# Patient Record
Sex: Female | Born: 1970 | Race: White | Hispanic: No | Marital: Married | Smoking: Never smoker
Health system: Southern US, Community
[De-identification: ages and names within clinical notes are randomized; demographics above are authoritative.]

## PROBLEM LIST (undated history)

## (undated) DIAGNOSIS — R519 Headache, unspecified: Secondary | ICD-10-CM

## (undated) DIAGNOSIS — R112 Nausea with vomiting, unspecified: Secondary | ICD-10-CM

## (undated) DIAGNOSIS — R0600 Dyspnea, unspecified: Secondary | ICD-10-CM

## (undated) DIAGNOSIS — K219 Gastro-esophageal reflux disease without esophagitis: Secondary | ICD-10-CM

## (undated) DIAGNOSIS — R52 Pain, unspecified: Secondary | ICD-10-CM

## (undated) DIAGNOSIS — M1990 Unspecified osteoarthritis, unspecified site: Secondary | ICD-10-CM

## (undated) DIAGNOSIS — M79601 Pain in right arm: Secondary | ICD-10-CM

## (undated) DIAGNOSIS — G589 Mononeuropathy, unspecified: Secondary | ICD-10-CM

## (undated) DIAGNOSIS — Z9889 Other specified postprocedural states: Secondary | ICD-10-CM

## (undated) DIAGNOSIS — S149XXA Injury of unspecified nerves of neck, initial encounter: Secondary | ICD-10-CM

## (undated) DIAGNOSIS — R51 Headache: Secondary | ICD-10-CM

## (undated) HISTORY — PX: DILATION AND CURETTAGE OF UTERUS: SHX78

## (undated) HISTORY — PX: SPINAL FUSION: SHX223

## (undated) HISTORY — PX: TUBAL LIGATION: SHX77

## (undated) HISTORY — PX: COLLATERAL LIGAMENT REPAIR, ELBOW: SHX1367

## (undated) HISTORY — PX: TONSILLECTOMY: SUR1361

---

## 2008-03-16 ENCOUNTER — Ambulatory Visit: Payer: Self-pay | Admitting: Family Medicine

## 2008-10-14 ENCOUNTER — Ambulatory Visit: Payer: Self-pay | Admitting: Family Medicine

## 2009-03-05 ENCOUNTER — Emergency Department: Payer: Self-pay | Admitting: Emergency Medicine

## 2009-03-07 ENCOUNTER — Ambulatory Visit: Payer: Self-pay | Admitting: Unknown Physician Specialty

## 2009-05-07 ENCOUNTER — Ambulatory Visit: Payer: Self-pay | Admitting: Internal Medicine

## 2010-01-27 ENCOUNTER — Inpatient Hospital Stay (HOSPITAL_COMMUNITY): Admission: AD | Admit: 2010-01-27 | Discharge: 2010-01-29 | Payer: Self-pay | Admitting: Obstetrics and Gynecology

## 2010-03-22 ENCOUNTER — Ambulatory Visit (HOSPITAL_COMMUNITY): Admission: RE | Admit: 2010-03-22 | Discharge: 2010-03-22 | Payer: Self-pay | Admitting: Obstetrics and Gynecology

## 2010-04-03 ENCOUNTER — Encounter: Admission: RE | Admit: 2010-04-03 | Discharge: 2010-04-03 | Payer: Self-pay | Admitting: Neurosurgery

## 2010-04-09 ENCOUNTER — Encounter: Admission: RE | Admit: 2010-04-09 | Discharge: 2010-04-09 | Payer: Self-pay | Admitting: Neurosurgery

## 2011-02-27 LAB — CBC
HCT: 29.8 % — ABNORMAL LOW (ref 36.0–46.0)
MCHC: 32.9 g/dL (ref 30.0–36.0)
MCV: 94.3 fL (ref 78.0–100.0)
Platelets: 257 10*3/uL (ref 150–400)
Platelets: 310 10*3/uL (ref 150–400)
RBC: 3.17 MIL/uL — ABNORMAL LOW (ref 3.87–5.11)
RDW: 13.7 % (ref 11.5–15.5)
RDW: 14.1 % (ref 11.5–15.5)
WBC: 5.5 10*3/uL (ref 4.0–10.5)
WBC: 10.8 10*3/uL — ABNORMAL HIGH (ref 4.0–10.5)
WBC: 13.1 10*3/uL — ABNORMAL HIGH (ref 4.0–10.5)

## 2011-02-27 LAB — RPR: RPR Ser Ql: NONREACTIVE

## 2011-06-26 ENCOUNTER — Ambulatory Visit: Payer: Self-pay | Admitting: Unknown Physician Specialty

## 2012-05-16 ENCOUNTER — Ambulatory Visit: Payer: Self-pay | Admitting: Internal Medicine

## 2012-05-16 LAB — CBC WITH DIFFERENTIAL/PLATELET
Basophil #: 0 10*3/uL (ref 0.0–0.1)
HCT: 45.7 % (ref 35.0–47.0)
HGB: 15.1 g/dL (ref 12.0–16.0)
Lymphocyte #: 0.6 10*3/uL — ABNORMAL LOW (ref 1.0–3.6)
MCH: 31.5 pg (ref 26.0–34.0)
Monocyte #: 0.7 x10 3/mm (ref 0.2–0.9)
Neutrophil %: 90.3 %
Platelet: 176 10*3/uL (ref 150–440)
RBC: 4.79 10*6/uL (ref 3.80–5.20)
RDW: 13.2 % (ref 11.5–14.5)
WBC: 13 10*3/uL — ABNORMAL HIGH (ref 3.6–11.0)

## 2012-05-16 LAB — URINALYSIS, COMPLETE
Glucose,UR: NEGATIVE mg/dL (ref 0–75)
Leukocyte Esterase: NEGATIVE
Nitrite: NEGATIVE
Ph: 8 (ref 4.5–8.0)
Specific Gravity: 1.015 (ref 1.003–1.030)
WBC UR: NONE SEEN /HPF (ref 0–5)

## 2012-05-16 LAB — COMPREHENSIVE METABOLIC PANEL
Alkaline Phosphatase: 79 U/L (ref 50–136)
Anion Gap: 10 (ref 7–16)
Chloride: 99 mmol/L (ref 98–107)
Co2: 27 mmol/L (ref 21–32)
Creatinine: 0.72 mg/dL (ref 0.60–1.30)
Glucose: 120 mg/dL — ABNORMAL HIGH (ref 65–99)
SGPT (ALT): 24 U/L
Sodium: 136 mmol/L (ref 136–145)
Total Protein: 7.4 g/dL (ref 6.4–8.2)

## 2012-05-18 LAB — URINE CULTURE

## 2013-04-19 DIAGNOSIS — N3020 Other chronic cystitis without hematuria: Secondary | ICD-10-CM | POA: Insufficient documentation

## 2013-04-19 DIAGNOSIS — R339 Retention of urine, unspecified: Secondary | ICD-10-CM | POA: Insufficient documentation

## 2013-04-19 DIAGNOSIS — N393 Stress incontinence (female) (male): Secondary | ICD-10-CM | POA: Insufficient documentation

## 2014-01-31 ENCOUNTER — Ambulatory Visit: Payer: Self-pay | Admitting: Internal Medicine

## 2014-03-15 ENCOUNTER — Other Ambulatory Visit: Payer: Self-pay | Admitting: Obstetrics and Gynecology

## 2014-03-15 ENCOUNTER — Encounter (HOSPITAL_COMMUNITY): Payer: Self-pay | Admitting: *Deleted

## 2014-03-17 ENCOUNTER — Encounter (HOSPITAL_COMMUNITY): Payer: Self-pay | Admitting: Pharmacist

## 2014-03-17 NOTE — H&P (Signed)
NAMRico Ala:  Veronica [name], Veronica [name]                 ACCOUNT NO.:  [id number]  MEDICAL RECORD NO.:  [id number]  LOCATION:  [facility]                         FACILITY:  [facility]  PHYSICIAN:  Lenoard Adenichard J. Gaelen Brager, M.D.DATE OF BIRTH:  04-Nov-1971  DATE OF ADMISSION:  03/18/2014 DATE OF DISCHARGE:                             HISTORY & PHYSICAL   CHIEF COMPLAINT:  Symptomatic rectocele, refractory menorrhagia with desire for definitive therapy.  HISTORY OF PRESENT ILLNESS:  The patient is a 43 year old white female, G3, P1 with a history of tubal ligation who presented for aforementioned indications for surgery.  ALLERGIES:  TYLENOL AND PENICILLIN.  FAMILY HISTORY:  Diabetes, hypertension, kidney disease, heart failure, mental retardation.  SURGICAL HISTORY:  Pregnancy is remarkable for 2 vaginal deliveries and 1 spontaneous delivery.  PAST SURGICAL HISTORY:  Spinal fusion, tubal ligation, neck surgery, D and C.  PHYSICAL EXAMINATION:  GENERAL:  She is a well-developed, well- nourished, white female, in no acute distress. HEENT:  Normal. NECK:  Supple.  Full range of motion. LUNGS:  Clear. HEART:  Regular rate and rhythm. ABDOMEN:  Soft, nontender. PELVIC:  Normal size uterus.  No adnexal masses.  Grade 1-2 rectocele noted. EXTREMITIES:  There are no cords. NEUROLOGIC: Nonfocal. SKIN:  Intact.  IMPRESSION:  Refractory Menorrhagia Symptomatic pelvic relaxation  PLANS:  Diagnostic hysteroscopy, D and C, NovaSure endometrial ablation, anterior colporrhaphy, possible perineorrhaphy, possible enterocele repair.  Risks of anesthesia, infection, bleeding, injury to surrounding organs, possible need for repair is discussed.  Delayed versus immediate complications to include bowel and bladder injury with need for repair.  Consent signed and pt wishes to proceed.     Lenoard Adenichard J. Egor Fullilove, M.D.     RJT/MEDQ  D:  03/17/2014  T:  03/17/2014  Job:  [id number]

## 2014-03-18 ENCOUNTER — Ambulatory Visit (HOSPITAL_COMMUNITY): Payer: 59 | Admitting: Anesthesiology

## 2014-03-18 ENCOUNTER — Encounter (HOSPITAL_COMMUNITY)
Admission: RE | Disposition: A | Discharge status: Home / Self Care | Payer: Self-pay | Source: Ambulatory Visit | Attending: Obstetrics and Gynecology

## 2014-03-18 ENCOUNTER — Encounter (HOSPITAL_COMMUNITY): Payer: Self-pay | Admitting: *Deleted

## 2014-03-18 ENCOUNTER — Encounter (HOSPITAL_COMMUNITY): Payer: 59 | Admitting: Anesthesiology

## 2014-03-18 ENCOUNTER — Ambulatory Visit (HOSPITAL_COMMUNITY)
Admission: RE | Admit: 2014-03-18 | Discharge: 2014-03-18 | Disposition: A | Discharge status: Home / Self Care | Payer: 59 | Source: Ambulatory Visit | Attending: Obstetrics and Gynecology | Admitting: Obstetrics and Gynecology

## 2014-03-18 DIAGNOSIS — N840 Polyp of corpus uteri: Secondary | ICD-10-CM | POA: Insufficient documentation

## 2014-03-18 DIAGNOSIS — N816 Rectocele: Secondary | ICD-10-CM | POA: Insufficient documentation

## 2014-03-18 DIAGNOSIS — Z9851 Tubal ligation status: Secondary | ICD-10-CM | POA: Insufficient documentation

## 2014-03-18 DIAGNOSIS — N920 Excessive and frequent menstruation with regular cycle: Secondary | ICD-10-CM | POA: Insufficient documentation

## 2014-03-18 HISTORY — DX: Nausea with vomiting, unspecified: R11.2

## 2014-03-18 HISTORY — PX: RECTOCELE REPAIR: SHX761

## 2014-03-18 HISTORY — PX: DILITATION & CURRETTAGE/HYSTROSCOPY WITH VERSAPOINT RESECTION: SHX5571

## 2014-03-18 HISTORY — PX: NOVASURE ABLATION: SHX5394

## 2014-03-18 HISTORY — DX: Nausea with vomiting, unspecified: Z98.890

## 2014-03-18 LAB — CBC
HCT: 43.8 % (ref 36.0–46.0)
Hemoglobin: 14.7 g/dL (ref 12.0–15.0)
MCH: 31.4 pg (ref 26.0–34.0)
MCHC: 33.6 g/dL (ref 30.0–36.0)
MCV: 93.6 fL (ref 78.0–100.0)
PLATELETS: 299 10*3/uL (ref 150–400)
RBC: 4.68 MIL/uL (ref 3.87–5.11)
RDW: 12.9 % (ref 11.5–15.5)
WBC: 8.6 10*3/uL (ref 4.0–10.5)

## 2014-03-18 LAB — HCG, SERUM, QUALITATIVE: Preg, Serum: NEGATIVE

## 2014-03-18 SURGERY — DILATATION & CURETTAGE/HYSTEROSCOPY WITH VERSAPOINT RESECTION
Anesthesia: General | Site: Vagina

## 2014-03-18 MED ORDER — BUPIVACAINE-EPINEPHRINE (PF) 0.5% -1:200000 IJ SOLN
INTRAMUSCULAR | Status: AC
  Filled 2014-03-18: qty 10

## 2014-03-18 MED ORDER — MIDAZOLAM HCL 2 MG/2ML IJ SOLN
INTRAMUSCULAR | Status: AC
  Filled 2014-03-18: qty 2

## 2014-03-18 MED ORDER — ONDANSETRON HCL 4 MG/2ML IJ SOLN
INTRAMUSCULAR | Status: AC
  Filled 2014-03-18: qty 2

## 2014-03-18 MED ORDER — ESTRADIOL 0.1 MG/GM VA CREA
TOPICAL_CREAM | VAGINAL | Status: AC
  Filled 2014-03-18: qty 42.5

## 2014-03-18 MED ORDER — LIDOCAINE HCL (CARDIAC) 20 MG/ML IV SOLN
INTRAVENOUS | Status: AC
  Filled 2014-03-18: qty 5

## 2014-03-18 MED ORDER — OXYCODONE-ACETAMINOPHEN 5-325 MG PO TABS: 1.0000 | ORAL_TABLET | ORAL | Status: DC | PRN

## 2014-03-18 MED ORDER — PHENYLEPHRINE 40 MCG/ML (10ML) SYRINGE FOR IV PUSH (FOR BLOOD PRESSURE SUPPORT)
PREFILLED_SYRINGE | INTRAVENOUS | Status: AC
  Filled 2014-03-18: qty 5

## 2014-03-18 MED ORDER — PHENYLEPHRINE HCL 10 MG/ML IJ SOLN
INTRAMUSCULAR | Status: DC | PRN
  Administered 2014-03-18 (×11): 40 ug via INTRAVENOUS

## 2014-03-18 MED ORDER — BUPIVACAINE-EPINEPHRINE 0.25% -1:200000 IJ SOLN
INTRAMUSCULAR | Status: DC | PRN
  Administered 2014-03-18: 10 mL

## 2014-03-18 MED ORDER — GENTAMICIN SULFATE 40 MG/ML IJ SOLN
INTRAVENOUS | Status: AC
  Administered 2014-03-18: 114.25 mL via INTRAVENOUS
  Filled 2014-03-18: qty 8.25

## 2014-03-18 MED ORDER — DEXAMETHASONE SODIUM PHOSPHATE 10 MG/ML IJ SOLN
INTRAMUSCULAR | Status: DC | PRN
  Administered 2014-03-18: 5 mg via INTRAVENOUS

## 2014-03-18 MED ORDER — ONDANSETRON HCL 4 MG/2ML IJ SOLN
INTRAMUSCULAR | Status: DC | PRN
  Administered 2014-03-18: 4 mg via INTRAVENOUS

## 2014-03-18 MED ORDER — BUPIVACAINE-EPINEPHRINE PF 0.25-1:200000 % IJ SOLN
INTRAMUSCULAR | Status: AC
  Filled 2014-03-18: qty 30

## 2014-03-18 MED ORDER — LIDOCAINE HCL (CARDIAC) 20 MG/ML IV SOLN
INTRAVENOUS | Status: DC | PRN
  Administered 2014-03-18: 60 mg via INTRAVENOUS

## 2014-03-18 MED ORDER — MIDAZOLAM HCL 2 MG/2ML IJ SOLN
INTRAMUSCULAR | Status: DC | PRN
  Administered 2014-03-18: 2 mg via INTRAVENOUS

## 2014-03-18 MED ORDER — SUCCINYLCHOLINE CHLORIDE 20 MG/ML IJ SOLN
INTRAMUSCULAR | Status: AC
  Filled 2014-03-18: qty 10

## 2014-03-18 MED ORDER — FENTANYL CITRATE 0.05 MG/ML IJ SOLN
INTRAMUSCULAR | Status: AC
  Filled 2014-03-18: qty 5

## 2014-03-18 MED ORDER — BUPIVACAINE HCL (PF) 0.25 % IJ SOLN
INTRAMUSCULAR | Status: DC | PRN
  Administered 2014-03-18: 10 mL

## 2014-03-18 MED ORDER — SODIUM CHLORIDE 0.9 % IJ SOLN
INTRAMUSCULAR | Status: DC | PRN
  Administered 2014-03-18: 50 mL via INTRAVENOUS

## 2014-03-18 MED ORDER — VASOPRESSIN 20 UNIT/ML IJ SOLN
INTRAVENOUS | Status: DC | PRN
  Administered 2014-03-18: 16:00:00 via INTRAMUSCULAR

## 2014-03-18 MED ORDER — DEXAMETHASONE SODIUM PHOSPHATE 10 MG/ML IJ SOLN
INTRAMUSCULAR | Status: AC
  Filled 2014-03-18: qty 1

## 2014-03-18 MED ORDER — VASOPRESSIN 20 UNIT/ML IJ SOLN
INTRAMUSCULAR | Status: AC
  Filled 2014-03-18: qty 1

## 2014-03-18 MED ORDER — SODIUM CHLORIDE 0.9 % IR SOLN
Status: DC | PRN
  Administered 2014-03-18: 3000 mL

## 2014-03-18 MED ORDER — SODIUM CHLORIDE 0.9 % IJ SOLN
INTRAMUSCULAR | Status: AC
  Filled 2014-03-18: qty 50

## 2014-03-18 MED ORDER — FENTANYL CITRATE 0.05 MG/ML IJ SOLN
INTRAMUSCULAR | Status: AC
  Filled 2014-03-18: qty 2

## 2014-03-18 MED ORDER — PROPOFOL 10 MG/ML IV EMUL
INTRAVENOUS | Status: AC
  Filled 2014-03-18: qty 20

## 2014-03-18 MED ORDER — LACTATED RINGERS IV SOLN
INTRAVENOUS | Status: DC
  Administered 2014-03-18 (×5): via INTRAVENOUS

## 2014-03-18 MED ORDER — FENTANYL CITRATE 0.05 MG/ML IJ SOLN
INTRAMUSCULAR | Status: DC | PRN
  Administered 2014-03-18 (×6): 50 ug via INTRAVENOUS

## 2014-03-18 MED ORDER — BUPIVACAINE HCL (PF) 0.25 % IJ SOLN
INTRAMUSCULAR | Status: AC
  Filled 2014-03-18: qty 30

## 2014-03-18 MED ORDER — OXYTOCIN 10 UNIT/ML IJ SOLN
INTRAMUSCULAR | Status: AC
  Filled 2014-03-18: qty 4

## 2014-03-18 MED ORDER — FENTANYL CITRATE 0.05 MG/ML IJ SOLN: 25.0000 ug | INTRAMUSCULAR | Status: DC | PRN

## 2014-03-18 MED ORDER — PROPOFOL 10 MG/ML IV BOLUS
INTRAVENOUS | Status: DC | PRN
  Administered 2014-03-18: 50 mg via INTRAVENOUS
  Administered 2014-03-18: 150 mg via INTRAVENOUS

## 2014-03-18 MED ORDER — OXYCODONE-ACETAMINOPHEN 5-325 MG PO TABS
ORAL_TABLET | ORAL | Status: AC
  Filled 2014-03-18: qty 1

## 2014-03-18 SURGICAL SUPPLY — 35 items
ABLATOR ENDOMETRIAL BIPOLAR (ABLATOR) ×3 IMPLANT
CANISTER SUCT 3000ML (MISCELLANEOUS) ×3 IMPLANT
CATH ROBINSON RED A/P 16FR (CATHETERS) ×3 IMPLANT
CLOTH BEACON ORANGE TIMEOUT ST (SAFETY) ×3 IMPLANT
CONT PATH 16OZ SNAP LID 3702 (MISCELLANEOUS) IMPLANT
CONTAINER PREFILL 10% NBF 60ML (FORM) ×6 IMPLANT
DECANTER SPIKE VIAL GLASS SM (MISCELLANEOUS) ×3 IMPLANT
DRAPE HYSTEROSCOPY (DRAPE) ×3 IMPLANT
DRSG TELFA 3X8 NADH (GAUZE/BANDAGES/DRESSINGS) ×3 IMPLANT
ELECT NEEDLE TIP 2.8 STRL (NEEDLE) IMPLANT
ELECTRODE RT ANGLE VERSAPOINT (CUTTING LOOP) ×3 IMPLANT
GLOVE BIO SURGEON STRL SZ7.5 (GLOVE) ×6 IMPLANT
GOWN STRL REUS W/TWL LRG LVL3 (GOWN DISPOSABLE) ×15 IMPLANT
NEEDLE HYPO 22GX1.5 SAFETY (NEEDLE) ×3 IMPLANT
NEEDLE SPNL 22GX3.5 QUINCKE BK (NEEDLE) ×6 IMPLANT
NS IRRIG 1000ML POUR BTL (IV SOLUTION) ×3 IMPLANT
PACK VAGINAL MINOR WOMEN LF (CUSTOM PROCEDURE TRAY) ×3 IMPLANT
PACK VAGINAL WOMENS (CUSTOM PROCEDURE TRAY) ×3 IMPLANT
PAD OB MATERNITY 4.3X12.25 (PERSONAL CARE ITEMS) ×3 IMPLANT
PAD PREP 24X48 CUFFED NSTRL (MISCELLANEOUS) ×3 IMPLANT
SET TUBING HYSTEROSCOPY 2 NDL (TUBING) IMPLANT
SUT MON AB 2-0 CT2 27 (SUTURE) IMPLANT
SUT VIC AB 0 CT1 18XCR BRD8 (SUTURE) ×2 IMPLANT
SUT VIC AB 0 CT1 27 (SUTURE) ×1
SUT VIC AB 0 CT1 27XBRD ANBCTR (SUTURE) ×2 IMPLANT
SUT VIC AB 0 CT1 8-18 (SUTURE) ×1
SUT VIC AB 2-0 CT1 27 (SUTURE) ×3
SUT VIC AB 2-0 CT1 TAPERPNT 27 (SUTURE) ×6 IMPLANT
SUT VICRYL 3 0 CT 3 (SUTURE) IMPLANT
SYR CONTROL 10ML LL (SYRINGE) ×6 IMPLANT
SYR TB 1ML 25GX5/8 (SYRINGE) ×3 IMPLANT
TOWEL OR 17X24 6PK STRL BLUE (TOWEL DISPOSABLE) ×6 IMPLANT
TRAY FOLEY CATH 14FR (SET/KITS/TRAYS/PACK) ×3 IMPLANT
TUBE HYSTEROSCOPY W Y-CONNECT (TUBING) IMPLANT
WATER STERILE IRR 1000ML POUR (IV SOLUTION) IMPLANT

## 2014-03-18 NOTE — Discharge Instructions (Signed)

## 2014-03-18 NOTE — Progress Notes (Signed)
Patient ID: Roselind RilyLila J Nitschke, female   DOB: 01-15-1971, 43 y.o.   MRN: [id number] Patient seen and examined. Consent witnessed and signed. No changes noted. Update completed.

## 2014-03-18 NOTE — Anesthesia Postprocedure Evaluation (Signed)
  Anesthesia Post-op Note  Patient: Veronica [name]  Procedure(s) Performed: Procedure(s) with comments: DILATATION & CURETTAGE/HYSTEROSCOPY WITH VERSAPOINT RESECTION (N/A) - 90 min. total NOVASURE ABLATION (N/A) POSTERIOR REPAIR (RECTOCELE)/Perineorrhaphy (N/A) Patient is awake and responsive. Pain and nausea are reasonably well controlled. Vital signs are stable and clinically acceptable. Oxygen saturation is clinically acceptable. There are no apparent anesthetic complications at this time. Patient is ready for discharge.

## 2014-03-18 NOTE — Op Note (Signed)
03/18/2014  5:29 PM  PATIENT:  Veronica [name]  43 y.o. female  PRE-OPERATIVE DIAGNOSIS:  Rectocele, Menorrhagia  POST-OPERATIVE DIAGNOSIS:  Rectocele, Menorrhagia  PROCEDURE:  Procedure(s): DILATATION & CURETTAGE DIAG HYSTEROSCOPY WITH VERSAPOINT RESECTION NOVASURE ABLATION POSTERIOR REPAIR (RECTOCELE)/Perineorrhaphy ENTEROCELE REPAIR  SURGEON:  Surgeon(s): Lenoard Adenichard J Marlei Glomski, MD  ASSISTANTSDenyse Amass: BHAMBRI, CNM   ANESTHESIA:   local and general  ESTIMATED BLOOD LOSS: * No blood loss amount entered *   DRAINS: Urinary Catheter (Foley)   LOCAL MEDICATIONS USED:  MARCAINE     SPECIMEN:  Source of Specimen:  EMC AND POLYP  DISPOSITION OF SPECIMEN:  PATHOLOGY  COUNTS:  YES  DICTATION #: [id number]  PLAN OF CARE: DC HOME  PATIENT DISPOSITION:  PACU - hemodynamically stable.

## 2014-03-18 NOTE — Anesthesia Preprocedure Evaluation (Signed)
Anesthesia Evaluation  Patient identified by MRN, date of birth, ID band Patient awake    Reviewed: Allergy & Precautions, H&P , Patient's Chart, lab work & pertinent test results, reviewed documented beta blocker date and time   Airway Mallampati: II TM Distance: >3 FB Neck ROM: full    Dental no notable dental hx.    Pulmonary  breath sounds clear to auscultation  Pulmonary exam normal       Cardiovascular Rhythm:regular Rate:Normal     Neuro/Psych    GI/Hepatic   Endo/Other    Renal/GU      Musculoskeletal   Abdominal   Peds  Hematology   Anesthesia Other Findings Offered regional for Rx of NV  Reproductive/Obstetrics                           Anesthesia Physical Anesthesia Plan  ASA: II  Anesthesia Plan:    Post-op Pain Management:    Induction: Intravenous  Airway Management Planned: LMA  Additional Equipment:   Intra-op Plan:   Post-operative Plan:   Informed Consent: I have reviewed the patients History and Physical, chart, labs and discussed the procedure including the risks, benefits and alternatives for the proposed anesthesia with the patient or authorized representative who has indicated his/her understanding and acceptance.   Dental Advisory Given and Dental advisory given  Plan Discussed with: CRNA and Surgeon  Anesthesia Plan Comments: (Discussed GA with LMA, possible sore throat, potential need to switch to ETT, N/V, pulmonary aspiration. Questions answered. )        Anesthesia Quick Evaluation

## 2014-03-18 NOTE — Transfer of Care (Signed)
Immediate Anesthesia Transfer of Care Note  Patient: Veronica [name]  Procedure(s) Performed: Procedure(s) with comments: DILATATION & CURETTAGE/HYSTEROSCOPY WITH VERSAPOINT RESECTION (N/A) - 90 min. total NOVASURE ABLATION (N/A) POSTERIOR REPAIR (RECTOCELE)/Perineorrhaphy (N/A)  Patient Location: PACU  Anesthesia Type:General  Level of Consciousness: awake, alert , oriented and patient cooperative  Airway & Oxygen Therapy: Patient Spontanous Breathing and Patient connected to nasal cannula oxygen  Post-op Assessment: Report given to PACU RN and Post -op Vital signs reviewed and stable  Post vital signs: Reviewed and stable  Complications: No apparent anesthesia complications

## 2014-03-19 NOTE — Op Note (Signed)
NAMRico [name]:  Pell, Yeila                 ACCOUNT NO.:  [id number]  MEDICAL RECORD NO.:  [id number]  LOCATION:  [facility]                          FACILITY:  [facility]  PHYSICIAN:  Lenoard Adenichard J. Katlin Bortner, M.D.DATE OF BIRTH:  [birth year]  DATE OF PROCEDURE:  03/18/2014 DATE OF DISCHARGE:  03/18/2014                              OPERATIVE REPORT   PREOPERATIVE DIAGNOSES:  Refractory menorrhagia, probable endometrial polyp, symptomatic pelvic relaxation.  POSTOPERATIVE DIAGNOSES:  Refractory menorrhagia, probable endometrial polyp, symptomatic pelvic relaxation plus enterocele.  PROCEDURE:  Diagnostic hysteroscopy, dilation and curettage, Versapoint resection of endometrial polyp, NovaSure endometrial ablation, enterocele repair, rectocele repair, perineorrhaphy.  SURGEON:  Lenoard Adenichard J. Jaki Hammerschmidt, MD.  ASSISTANDenyse Amass:  Bhambri, CNM  ESTIMATED BLOOD LOSS:  50 mL.  COMPLICATIONS:  None.  DRAINS:  Foley.  COUNTS:  Correct.  The patient to recovery in good condition.  BRIEF OPERATIVE NOTE:  After being apprised of the risks of anesthesia, infection, bleeding, injury to surrounding organs, possible need for repair, delayed versus immediate complications to include bowel and bladder injury, possible need for repair, the patient was brought to the operating room where she was administered a general anesthetic without complications.  Prepped and draped in usual sterile fashion.  Foley catheter placed.  Exam under anesthesia revealed a bulky mid positioned uterus and no adnexal masses.  A dilute paracervical block and a dilute Pitressin solution are placed in standard fashion.  Marcaine used for a paracervical block.  Pitressin placed at 3 and 9 o'clock at the cervicovaginal junction 20 mL total.  Cervix easily dilated up to a #33 Pratt dilator.  Hysteroscope placed.  Visualization reveals a fundal endometrial polyp, which was resected using the Versapoint resection. Fluid deficit 250 mL.  D and C performed.   Sharp curettage in a 4- quadrant method.  Repeat visualization reveals an otherwise normal endometrial cavity.  NovaSure device was then seated to a length of 6 and a width of 2.5 and was initiated after negative CO2 test for 1 minute with a power of 86 watts.  The device was removed and found to be intact.  Visualization of the endometrium reveals a well ablated cavity and the instruments were removed.  Fluid deficit is as noted.  Attention was turned to the rectocele whereby rectal exam confirms the cephalad portion of the rectocele.  A dilute Marcaine epinephrine solution was infiltrated into the posterior vaginal wall and perineum.  A triangular wedge shaped tissue was removed from the perineum and the posterior vaginal mucosa was undermined and opened.  The rectovaginal fascia is then dissected sharply off the vaginal mucosa exposing a rectocele and then a posterior enterocele.  The enterocele sac is isolated and with a finger in the rectum is plicated distally using multiple 0 Vicryl pursestring sutures.  The rectocele was then imbricated in a pursestring fashion using 0 Vicryl, and then oversewn using interrupted sutures of 0 Vicryl suture establishing good posterior support.  The vaginal mucosa was closed with figure-of-eight 2-0 Vicryl sutures and the perineorrhaphy was performed using a 2-0 Vicryl suture as well.  Good hemostasis was achieved.  The patient tolerated the procedure well, was awakened, and transferred to recovery in good  condition.     Lenoard Aden, M.D.     RJT/MEDQ  D:  03/18/2014  T:  03/19/2014  Job:  [id number]

## 2014-03-21 ENCOUNTER — Encounter (HOSPITAL_COMMUNITY): Payer: Self-pay | Admitting: Obstetrics and Gynecology

## 2015-10-23 ENCOUNTER — Other Ambulatory Visit: Payer: Self-pay | Admitting: Obstetrics and Gynecology

## 2015-10-23 HISTORY — PX: ABDOMINAL HYSTERECTOMY: SHX81

## 2015-11-20 ENCOUNTER — Other Ambulatory Visit: Payer: Self-pay | Admitting: Internal Medicine

## 2015-11-20 DIAGNOSIS — E041 Nontoxic single thyroid nodule: Secondary | ICD-10-CM

## 2015-11-20 DIAGNOSIS — M5412 Radiculopathy, cervical region: Secondary | ICD-10-CM | POA: Insufficient documentation

## 2015-11-20 DIAGNOSIS — J309 Allergic rhinitis, unspecified: Secondary | ICD-10-CM | POA: Insufficient documentation

## 2015-11-20 DIAGNOSIS — E042 Nontoxic multinodular goiter: Secondary | ICD-10-CM | POA: Insufficient documentation

## 2015-11-20 DIAGNOSIS — K219 Gastro-esophageal reflux disease without esophagitis: Secondary | ICD-10-CM | POA: Insufficient documentation

## 2015-11-23 ENCOUNTER — Ambulatory Visit
Admission: RE | Admit: 2015-11-23 | Discharge: 2015-11-23 | Disposition: A | Discharge status: Home / Self Care | Payer: BLUE CROSS/BLUE SHIELD | Source: Ambulatory Visit | Attending: Internal Medicine | Admitting: Internal Medicine

## 2015-11-23 DIAGNOSIS — E041 Nontoxic single thyroid nodule: Secondary | ICD-10-CM | POA: Diagnosis present

## 2015-11-23 DIAGNOSIS — R591 Generalized enlarged lymph nodes: Secondary | ICD-10-CM | POA: Insufficient documentation

## 2015-12-25 ENCOUNTER — Encounter: Payer: Self-pay | Admitting: *Deleted

## 2016-01-01 NOTE — Discharge Instructions (Signed)

## 2016-01-02 ENCOUNTER — Ambulatory Visit: Payer: BLUE CROSS/BLUE SHIELD | Admitting: Anesthesiology

## 2016-01-02 ENCOUNTER — Ambulatory Visit
Admission: RE | Admit: 2016-01-02 | Discharge: 2016-01-02 | Disposition: A | Discharge status: Home / Self Care | Payer: BLUE CROSS/BLUE SHIELD | Source: Ambulatory Visit | Attending: Gastroenterology | Admitting: Gastroenterology

## 2016-01-02 ENCOUNTER — Encounter
Admission: RE | Disposition: A | Discharge status: Home / Self Care | Payer: Self-pay | Source: Ambulatory Visit | Attending: Gastroenterology

## 2016-01-02 DIAGNOSIS — Z8371 Family history of colonic polyps: Secondary | ICD-10-CM | POA: Insufficient documentation

## 2016-01-02 DIAGNOSIS — Z79899 Other long term (current) drug therapy: Secondary | ICD-10-CM | POA: Insufficient documentation

## 2016-01-02 DIAGNOSIS — Z880 Allergy status to penicillin: Secondary | ICD-10-CM | POA: Insufficient documentation

## 2016-01-02 DIAGNOSIS — Z1211 Encounter for screening for malignant neoplasm of colon: Secondary | ICD-10-CM | POA: Insufficient documentation

## 2016-01-02 DIAGNOSIS — Z886 Allergy status to analgesic agent status: Secondary | ICD-10-CM | POA: Insufficient documentation

## 2016-01-02 DIAGNOSIS — Z885 Allergy status to narcotic agent status: Secondary | ICD-10-CM | POA: Insufficient documentation

## 2016-01-02 DIAGNOSIS — Z9851 Tubal ligation status: Secondary | ICD-10-CM | POA: Insufficient documentation

## 2016-01-02 DIAGNOSIS — J309 Allergic rhinitis, unspecified: Secondary | ICD-10-CM | POA: Insufficient documentation

## 2016-01-02 DIAGNOSIS — Z90710 Acquired absence of both cervix and uterus: Secondary | ICD-10-CM | POA: Diagnosis not present

## 2016-01-02 DIAGNOSIS — K219 Gastro-esophageal reflux disease without esophagitis: Secondary | ICD-10-CM | POA: Diagnosis not present

## 2016-01-02 DIAGNOSIS — Z881 Allergy status to other antibiotic agents status: Secondary | ICD-10-CM | POA: Insufficient documentation

## 2016-01-02 HISTORY — DX: Mononeuropathy, unspecified: G58.9

## 2016-01-02 HISTORY — PX: ESOPHAGOGASTRODUODENOSCOPY: SHX5428

## 2016-01-02 HISTORY — DX: Pain in right arm: M79.601

## 2016-01-02 HISTORY — DX: Headache, unspecified: R51.9

## 2016-01-02 HISTORY — DX: Gastro-esophageal reflux disease without esophagitis: K21.9

## 2016-01-02 HISTORY — DX: Headache: R51

## 2016-01-02 HISTORY — PX: COLONOSCOPY: SHX5424

## 2016-01-02 HISTORY — DX: Unspecified osteoarthritis, unspecified site: M19.90

## 2016-01-02 HISTORY — DX: Injury of unspecified nerves of neck, initial encounter: S14.9XXA

## 2016-01-02 SURGERY — COLONOSCOPY
Anesthesia: Monitor Anesthesia Care | Wound class: Contaminated

## 2016-01-02 MED ORDER — GLYCOPYRROLATE 0.2 MG/ML IJ SOLN
INTRAMUSCULAR | Status: DC | PRN
  Administered 2016-01-02: .2 mg via INTRAVENOUS

## 2016-01-02 MED ORDER — FENTANYL CITRATE (PF) 100 MCG/2ML IJ SOLN: 25.0000 ug | INTRAMUSCULAR | Status: DC | PRN

## 2016-01-02 MED ORDER — SODIUM CHLORIDE 0.9 % IV SOLN: INTRAVENOUS | Status: DC

## 2016-01-02 MED ORDER — LIDOCAINE HCL (CARDIAC) 20 MG/ML IV SOLN
INTRAVENOUS | Status: DC | PRN
  Administered 2016-01-02: 40 mg via INTRAVENOUS

## 2016-01-02 MED ORDER — LACTATED RINGERS IV SOLN
INTRAVENOUS | Status: DC
  Administered 2016-01-02: 08:00:00 via INTRAVENOUS

## 2016-01-02 MED ORDER — DEXAMETHASONE SODIUM PHOSPHATE 4 MG/ML IJ SOLN: 8.0000 mg | Freq: Once | INTRAMUSCULAR | Status: DC | PRN

## 2016-01-02 MED ORDER — PROPOFOL 10 MG/ML IV BOLUS
INTRAVENOUS | Status: DC | PRN
  Administered 2016-01-02 (×3): 50 mg via INTRAVENOUS
  Administered 2016-01-02 (×2): 30 mg via INTRAVENOUS
  Administered 2016-01-02: 50 mg via INTRAVENOUS

## 2016-01-02 MED ORDER — STERILE WATER FOR IRRIGATION IR SOLN
Status: DC | PRN
  Administered 2016-01-02: 09:00:00

## 2016-01-02 SURGICAL SUPPLY — 41 items
BALLN DILATOR 10-12 8 (BALLOONS)
BALLN DILATOR 12-15 8 (BALLOONS)
BALLN DILATOR 15-18 8 (BALLOONS)
BALLN DILATOR CRE 0-12 8 (BALLOONS)
BALLN DILATOR ESOPH 8 10 CRE (MISCELLANEOUS) IMPLANT
BALLOON DILATOR 12-15 8 (BALLOONS) IMPLANT
BALLOON DILATOR 15-18 8 (BALLOONS) IMPLANT
BALLOON DILATOR CRE 0-12 8 (BALLOONS) IMPLANT
BLOCK BITE 60FR ADLT L/F GRN (MISCELLANEOUS) ×2 IMPLANT
CANISTER SUCT 1200ML W/VALVE (MISCELLANEOUS) ×2 IMPLANT
FCP ESCP3.2XJMB 240X2.8X (MISCELLANEOUS)
FORCEPS BIOP RAD 4 LRG CAP 4 (CUTTING FORCEPS) ×2 IMPLANT
FORCEPS BIOP RJ4 240 W/NDL (MISCELLANEOUS)
FORCEPS ESCP3.2XJMB 240X2.8X (MISCELLANEOUS) IMPLANT
GOWN CVR UNV OPN BCK APRN NK (MISCELLANEOUS) ×1 IMPLANT
GOWN ISOL THUMB LOOP REG UNIV (MISCELLANEOUS) ×1
GOWN STRL REUS W/ TWL LRG LVL3 (GOWN DISPOSABLE) ×1 IMPLANT
GOWN STRL REUS W/TWL LRG LVL3 (GOWN DISPOSABLE) ×1
HEMOCLIP INSTINCT (CLIP) IMPLANT
INJECTOR VARIJECT VIN23 (MISCELLANEOUS) IMPLANT
KIT CO2 TUBING (TUBING) IMPLANT
KIT DEFENDO VALVE AND CONN (KITS) IMPLANT
KIT ENDO PROCEDURE OLY (KITS) ×2 IMPLANT
LIGATOR MULTIBAND 6SHOOTER MBL (MISCELLANEOUS) IMPLANT
MARKER SPOT ENDO TATTOO 5ML (MISCELLANEOUS) IMPLANT
PAD GROUND ADULT SPLIT (MISCELLANEOUS) IMPLANT
SNARE SHORT THROW 13M SML OVAL (MISCELLANEOUS) IMPLANT
SNARE SHORT THROW 30M LRG OVAL (MISCELLANEOUS) IMPLANT
SPOT EX ENDOSCOPIC TATTOO (MISCELLANEOUS)
SUCTION POLY TRAP 4CHAMBER (MISCELLANEOUS) IMPLANT
SYR INFLATION 60ML (SYRINGE) IMPLANT
TRAP SUCTION POLY (MISCELLANEOUS) IMPLANT
TUBING CONN 6MMX3.1M (TUBING)
TUBING SUCTION CONN 0.25 STRL (TUBING) IMPLANT
UNDERPAD 30X60 958B10 (PK) (MISCELLANEOUS) IMPLANT
VALVE BIOPSY ENDO (VALVE) IMPLANT
VARIJECT INJECTOR VIN23 (MISCELLANEOUS)
WATER AUXILLARY (MISCELLANEOUS) IMPLANT
WATER STERILE IRR 250ML POUR (IV SOLUTION) ×2 IMPLANT
WATER STERILE IRR 500ML POUR (IV SOLUTION) IMPLANT
WIRE CRE 18-20MM 8CM F G (MISCELLANEOUS) IMPLANT

## 2016-01-02 NOTE — Op Note (Addendum)
[facility] Patient Name: Veronica [name] Procedure Date: 01/02/2016 9:22 AM MRN: [id number] Account #: [id number] Date of Birth: 06/14/1971 Admit Type: Outpatient Age: 45 Room: [facility] OR ROOM 01 Gender: Female Note Status: Supervisor Override Procedure:         Colonoscopy Indications:       Colon cancer screening in patient at increased risk:                     Family history of 1st-degree relative with colon polyps Providers:         Ezzard Standing. Bluford Kaufmann, MD Referring MD:      Neomia Dear. Harrington Challenger, MD (Referring MD) Medicines:         Monitored Anesthesia Care Complications:     No immediate complications. Procedure:         Pre-Anesthesia Assessment:                    - Prior to the procedure, a History and Physical was                     performed, and patient medications, allergies and                     sensitivities were reviewed. The patient's tolerance of                     previous anesthesia was reviewed.                    - The risks and benefits of the procedure and the sedation                     options and risks were discussed with the patient. All                     questions were answered and informed consent was obtained.                    - After reviewing the risks and benefits, the patient was                     deemed in satisfactory condition to undergo the procedure.                    After obtaining informed consent, the colonoscope was                     passed under direct vision. Throughout the procedure, the                     patient's blood pressure, pulse, and oxygen saturations                     were monitored continuously. The was introduced through                     the anus and advanced to the the cecum, identified by                     appendiceal orifice and ileocecal valve. The colonoscopy                     was performed without difficulty. The patient tolerated  the procedure well. The  quality of the bowel preparation                     was good. Findings:      The colon (entire examined portion) appeared normal. Impression:        - The entire examined colon is normal.                    - No specimens collected. Recommendation:    - Discharge patient to home.                    - Repeat colonoscopy in 5 years for surveillance.                    - The findings and recommendations were discussed with the                     patient. Procedure Code(s): --- Professional ---                    [phone], Colonoscopy, flexible; diagnostic, including                     collection of specimen(s) by brushing or washing, when                     performed (separate procedure) Diagnosis Code(s): --- Professional ---                    Z83.71, Family history of colonic polyps CPT copyright 2014 American Medical Association. All rights reserved. The codes documented in this report are preliminary and upon coder review may  be revised to meet current compliance requirements. Wallace Cullens, MD 01/02/2016 9:38:26 AM This report has been signed electronically. Number of Addenda: 0 Note Initiated On: 01/02/2016 9:22 AM Scope Withdrawal Time: 0 hours 4 minutes 37 seconds  Total Procedure Duration: 0 hours 10 minutes 55 seconds       [facility]

## 2016-01-02 NOTE — Anesthesia Procedure Notes (Signed)
Procedure Name: MAC Performed by: Adrianna Dudas Pre-anesthesia Checklist: Patient identified, Emergency Drugs available, Suction available, Timeout performed and Patient being monitored Patient Re-evaluated:Patient Re-evaluated prior to inductionOxygen Delivery Method: Nasal cannula Placement Confirmation: positive ETCO2     

## 2016-01-02 NOTE — Transfer of Care (Signed)
Immediate Anesthesia Transfer of Care Note  Patient: Veronica [name]  Procedure(s) Performed: Procedure(s): COLONOSCOPY (N/A) ESOPHAGOGASTRODUODENOSCOPY (EGD) (N/A)  Patient Location: PACU  Anesthesia Type: MAC  Level of Consciousness: awake, alert  and patient cooperative  Airway and Oxygen Therapy: Patient Spontanous Breathing and Patient connected to supplemental oxygen  Post-op Assessment: Post-op Vital signs reviewed, Patient's Cardiovascular Status Stable, Respiratory Function Stable, Patent Airway and No signs of Nausea or vomiting  Post-op Vital Signs: Reviewed and stable  Complications: No apparent anesthesia complications

## 2016-01-02 NOTE — Anesthesia Preprocedure Evaluation (Signed)
Anesthesia Evaluation  Patient identified by MRN, date of birth, ID band Patient awake    Reviewed: Allergy & Precautions, H&P , NPO status , Patient's Chart, lab work & pertinent test results, reviewed documented beta blocker date and time   History of Anesthesia Complications (+) PONV and history of anesthetic complications  Airway Mallampati: II  TM Distance: >3 FB Neck ROM: full    Dental no notable dental hx.    Pulmonary neg pulmonary ROS,    Pulmonary exam normal breath sounds clear to auscultation       Cardiovascular Exercise Tolerance: Good negative cardio ROS   Rhythm:regular Rate:Normal     Neuro/Psych  Headaches, negative psych ROS   GI/Hepatic Neg liver ROS, GERD  Medicated,  Endo/Other  negative endocrine ROS  Renal/GU negative Renal ROS  negative genitourinary   Musculoskeletal   Abdominal   Peds  Hematology negative hematology ROS (+)   Anesthesia Other Findings   Reproductive/Obstetrics negative OB ROS                             Anesthesia Physical Anesthesia Plan  ASA: II  Anesthesia Plan: MAC   Post-op Pain Management:    Induction:   Airway Management Planned:   Additional Equipment:   Intra-op Plan:   Post-operative Plan:   Informed Consent: I have reviewed the patients History and Physical, chart, labs and discussed the procedure including the risks, benefits and alternatives for the proposed anesthesia with the patient or authorized representative who has indicated his/her understanding and acceptance.     Plan Discussed with: CRNA  Anesthesia Plan Comments:         Anesthesia Quick Evaluation

## 2016-01-02 NOTE — Anesthesia Postprocedure Evaluation (Addendum)
Anesthesia Post Note  Patient: Veronica [name]  Procedure(s) Performed: Procedure(s) (LRB): COLONOSCOPY (N/A) ESOPHAGOGASTRODUODENOSCOPY (EGD) (N/A)  Patient location during evaluation: PACU Anesthesia Type: MAC Level of consciousness: awake and alert Pain management: pain level controlled Vital Signs Assessment: post-procedure vital signs reviewed and stable Respiratory status: spontaneous breathing, nonlabored ventilation and respiratory function stable Cardiovascular status: blood pressure returned to baseline and stable Postop Assessment: no signs of nausea or vomiting Anesthetic complications: no    Sema Stangler D Graves Nipp

## 2016-01-02 NOTE — Addendum Note (Signed)
Addendum  created 01/02/16 1010 by Andee Poles, CRNA   Modules edited: Anesthesia Medication Administration

## 2016-01-02 NOTE — Op Note (Signed)
[facility] Patient Name: Veronica [name] Procedure Date: 01/02/2016 9:13 AM MRN: [id number] Account #: [id number] Date of Birth: [birth year] Admit Type: Outpatient Age: 45 Room: [facility] OR ROOM 01 Gender: Female Note Status: Finalized Procedure:         Upper GI endoscopy Indications:       Suspected esophageal reflux Providers:         Ezzard Standing. Bluford Kaufmann, MD Referring MD:      Neomia Dear. Harrington Challenger, MD (Referring MD) Medicines:         Monitored Anesthesia Care Complications:     No immediate complications. Procedure:         Pre-Anesthesia Assessment:                    - Prior to the procedure, a History and Physical was                     performed, and patient medications, allergies and                     sensitivities were reviewed. The patient's tolerance of                     previous anesthesia was reviewed.                    - The risks and benefits of the procedure and the sedation                     options and risks were discussed with the patient. All                     questions were answered and informed consent was obtained.                    - After reviewing the risks and benefits, the patient was                     deemed in satisfactory condition to undergo the procedure.                    After obtaining informed consent, the endoscope was passed                     under direct vision. Throughout the procedure, the                     patient's blood pressure, pulse, and oxygen saturations                     were monitored continuously. The Olympus GIF-[id number]                     Endoscope (S#. [id number]) was introduced through the mouth,                     and advanced to the second part of duodenum. The patient                     tolerated the procedure well. Findings:      The examined esophagus was normal. Biopsies were taken with a cold       forceps for histology.      The entire examined stomach was normal.      The examined  duodenum was normal. Impression:        - Normal esophagus. Biopsied.                    - Normal stomach.                    - Normal examined duodenum. Recommendation:    - Discharge patient to home.                    - Observe patient's clinical course.                    - The findings and recommendations were discussed with the                     patient.                    - Await pathology results.                    - Continue present medications. Procedure Code(s): --- Professional ---                    [phone], Esophagogastroduodenoscopy, flexible, transoral;                     with biopsy, single or multiple CPT copyright 2014 American Medical Association. All rights reserved. The codes documented in this report are preliminary and upon coder review may  be revised to meet current compliance requirements. Wallace Cullens, MD 01/02/2016 9:22:46 AM This report has been signed electronically. Number of Addenda: 0 Note Initiated On: 01/02/2016 9:13 AM      [facility]

## 2016-01-02 NOTE — H&P (Signed)
  Date of Initial H&P:12/07/2015  History reviewed, patient examined, no change in status, stable for surgery. 

## 2016-01-03 ENCOUNTER — Encounter: Payer: Self-pay | Admitting: Gastroenterology

## 2016-01-04 LAB — SURGICAL PATHOLOGY

## 2016-01-11 ENCOUNTER — Other Ambulatory Visit: Payer: Self-pay | Admitting: Physician Assistant

## 2016-01-11 DIAGNOSIS — M5412 Radiculopathy, cervical region: Secondary | ICD-10-CM

## 2016-01-29 ENCOUNTER — Ambulatory Visit
Admission: RE | Admit: 2016-01-29 | Discharge: 2016-01-29 | Disposition: A | Discharge status: Home / Self Care | Payer: BLUE CROSS/BLUE SHIELD | Source: Ambulatory Visit | Attending: Physician Assistant | Admitting: Physician Assistant

## 2016-01-29 DIAGNOSIS — Z981 Arthrodesis status: Secondary | ICD-10-CM | POA: Diagnosis not present

## 2016-01-29 DIAGNOSIS — M5412 Radiculopathy, cervical region: Secondary | ICD-10-CM

## 2016-01-29 DIAGNOSIS — M5023 Other cervical disc displacement, cervicothoracic region: Secondary | ICD-10-CM | POA: Insufficient documentation

## 2016-01-29 MED ORDER — GADOBENATE DIMEGLUMINE 529 MG/ML IV SOLN
20.0000 mL | Freq: Once | INTRAVENOUS | Status: AC | PRN
  Administered 2016-01-29: 17 mL via INTRAVENOUS

## 2016-05-13 ENCOUNTER — Encounter
Admission: RE | Admit: 2016-05-13 | Discharge: 2016-05-13 | Disposition: A | Discharge status: Home / Self Care | Payer: BLUE CROSS/BLUE SHIELD | Source: Ambulatory Visit | Attending: Orthopedic Surgery | Admitting: Orthopedic Surgery

## 2016-05-13 ENCOUNTER — Encounter: Payer: Self-pay | Admitting: *Deleted

## 2016-05-13 DIAGNOSIS — Z01810 Encounter for preprocedural cardiovascular examination: Secondary | ICD-10-CM | POA: Insufficient documentation

## 2016-05-13 DIAGNOSIS — Z01812 Encounter for preprocedural laboratory examination: Secondary | ICD-10-CM | POA: Insufficient documentation

## 2016-05-13 LAB — CBC
HCT: 45.3 % (ref 35.0–47.0)
HEMOGLOBIN: 14.9 g/dL (ref 12.0–16.0)
MCH: 31.2 pg (ref 26.0–34.0)
MCHC: 32.9 g/dL (ref 32.0–36.0)
MCV: 94.7 fL (ref 80.0–100.0)
PLATELETS: 238 10*3/uL (ref 150–440)
RBC: 4.78 MIL/uL (ref 3.80–5.20)
RDW: 13.5 % (ref 11.5–14.5)
WBC: 7.1 10*3/uL (ref 3.6–11.0)

## 2016-05-13 LAB — BASIC METABOLIC PANEL
Anion gap: 8 (ref 5–15)
BUN: 20 mg/dL (ref 6–20)
CHLORIDE: 104 mmol/L (ref 101–111)
CO2: 29 mmol/L (ref 22–32)
CREATININE: 0.98 mg/dL (ref 0.44–1.00)
Calcium: 9.5 mg/dL (ref 8.9–10.3)
GFR calc non Af Amer: >60 mL/min (ref >60)
GLUCOSE: 95 mg/dL (ref 65–99)
Potassium: 3.9 mmol/L (ref 3.5–5.1)
Sodium: 141 mmol/L (ref 135–145)

## 2016-05-13 NOTE — Patient Instructions (Addendum)
  Your procedure is scheduled on: 05/20/16 Report to Day Surgery. [facility]OND FLOOR To find out your arrival time please call [phone] between 1PM - 3PM on 05/17/16  Remember: Instructions that are not followed completely may result in serious medical risk, up to and including death, or upon the discretion of your surgeon and anesthesiologist your surgery may need to be rescheduled.    _X___ 1. Do not eat food or drink liquids after midnight. No gum chewing or hard candies.     __X__ 2. No Alcohol for 24 hours before or after surgery.   ____ 3. Bring all medications with you on the day of surgery if instructed.    __X__ 4. Notify your doctor if there is any change in your medical condition     (cold, fever, infections).     Do not wear jewelry, make-up, hairpins, clips or nail polish.  Do not wear lotions, powders, or perfumes. You may wear deodorant.  Do not shave 48 hours prior to surgery. Men may shave face and neck.  Do not bring valuables to the hospital.    [facility] is not responsible for any belongings or valuables.               Contacts, dentures or bridgework may not be worn into surgery.  Leave your suitcase in the car. After surgery it may be brought to your room.  For patients admitted to the hospital, discharge time is determined by your                treatment team.   Patients discharged the day of surgery will not be allowed to drive home.   Please read over the following fact sheets that you were given:   Surgical Site Infection Prevention   __X__ Take these medicines the morning of surgery with A SIP OF WATER:    1. NEXIUM AT BEDTIME 05/19/16 AND AM OF SURGERY  2.   3.   4.  5.  6.  ____ Fleet Enema (as directed)   _X___ Use CHG Soap as directed  ____ Use inhalers on the day of surgery  ____ Stop metformin 2 days prior to surgery    ____ Take 1/2 of usual insulin dose the night before surgery and none on the morning of surgery.   ____  Stop Coumadin/Plavix/aspirin on  _X___ Stop Anti-inflammatories on    CALL DR HOOTEN'S OFFICE TO SEE IF MUST STOP NAPROXEN   ____ Stop supplements until after surgery.    ____ Bring C-Pap to the hospital.

## 2016-05-16 NOTE — Pre-Procedure Instructions (Addendum)
SPOKE WITH DR Randa NgoPISCITELLO REGARDING PT HAVING OCC TWING, SHARPE PAIN IN CHEST-PT CAME IN FOR EKG AND IT WAS NORMAL-MD STATES THAT HE WANTS MEDICAL CLEARANCE-TIFFANY AT DR HOOTENS OFFICE NOTIFIED OF THIS AND CLEARANCE AND EKG WAS FAXED OVER TO TIFFANY WITH FAX CONFIRMATION RECEIVED

## 2016-05-17 NOTE — Pre-Procedure Instructions (Signed)
SPOKE WITH TIFFANY RE STATUS OF CLEARANCE. PATIENT IS TO SEE DR Cassie FreerPARACHOS 05/20/16. WILL MOST LIKELY NOT HAVE SURGERY 05/20/16 AND TIFFANY WORKING TO LEAVE ON WAIT SCHEDULE.

## 2016-05-20 ENCOUNTER — Ambulatory Visit
Admission: RE | Admit: 2016-05-20 | Payer: BLUE CROSS/BLUE SHIELD | Source: Ambulatory Visit | Admitting: Orthopedic Surgery

## 2016-05-20 DIAGNOSIS — R079 Chest pain, unspecified: Secondary | ICD-10-CM | POA: Insufficient documentation

## 2016-05-20 DIAGNOSIS — R9431 Abnormal electrocardiogram [ECG] [EKG]: Secondary | ICD-10-CM | POA: Insufficient documentation

## 2016-05-20 DIAGNOSIS — Z01810 Encounter for preprocedural cardiovascular examination: Secondary | ICD-10-CM | POA: Insufficient documentation

## 2016-05-20 DIAGNOSIS — R0602 Shortness of breath: Secondary | ICD-10-CM | POA: Insufficient documentation

## 2016-05-20 HISTORY — DX: Pain, unspecified: R52

## 2016-05-20 SURGERY — CARPAL TUNNEL RELEASE
Anesthesia: Choice | Laterality: Right

## 2016-06-24 ENCOUNTER — Other Ambulatory Visit: Payer: BLUE CROSS/BLUE SHIELD

## 2016-06-25 NOTE — Pre-Procedure Instructions (Signed)
Veronica Millard, MD - 06/07/2016 9:00 AM EDT Formatting of this note may be different from the original. Established Patient Visit   Chief Complaint: Chief Complaint  Patient presents with  . Follow-up  Stress Test  Date of Service: 06/07/2016 Date of Birth: 1971-02-11 PCP: Hal Morales  History of Present Illness: Ms. Burgen is a 45 y.o.female patient who returns for preoperative cardiovascular evaluation prior to carpal tunnel syndrome surgery. Patient has intermittent episodes of this pain which occur with without exertion. Reports occasional episodes of heart racing. She underwent stress echocardiogram 06/05/16, exercise 9 minutes on a Bruce protocol without chest pain or ECG changes. At baseline 2D echocardiogram revealed normal left ventricular function, LVEF greater than 55%. At peak exercise, there was no echocardiographic evidence for ischemia.  Past Medical and Surgical History  Past Medical History Past Medical History:  Diagnosis Date  . Allergic rhinitis  . Carpal tunnel syndrome, right  . Chicken pox  . Cyst of thyroid 11/20/2015  . Female stress incontinence 04/19/2013  . Gastroesophageal reflux 11/20/2015  . Spinal stenosis of cervical region 11/09/2013  . Urge incontinence 04/19/2013   Past Surgical History She has a past surgical history that includes Tubal ligation; Tonsillectomy; Arthroscopy Shoulder Repair Slap Lesion; LABRUM SURGERY (Right); NECK SURGERY; Repair Rectocele (03/18/2014); Endometrial ablation w/ novasure (03/18/2014); Hysterectomy (10/23/2015); Colonoscopy (01/02/2016); and egd (01/02/2016).   Medications and Allergies  Current Medications  Current Outpatient Prescriptions  Medication Sig Dispense Refill  . esomeprazole (NEXIUM) 40 MG DR capsule Take 1 capsule (40 mg total) by mouth every morning. For reflux 30 capsule 5  . loratadine (CLARITIN) 10 mg tablet Take 10 mg by mouth once daily.  . traMADol (ULTRAM) 50 mg tablet Take 1-2 tablets  (50-100 mg total) by mouth every 4 (four) hours while awake. 60 tablet 0   No current facility-administered medications for this visit.   Allergies: Acetaminophen; Amoxicillin; Penicillins; and Oxycodone  Social and Family History  Social History reports that she has never smoked. She has never used smokeless tobacco. She reports that she does not drink alcohol or use illicit drugs.  Family History Family History  Problem Relation Age of Onset  . Kidney disease Mother  . Heart disease Mother  . Heart disease Father  . Heart attack Father  . Heart disease Sister  . Heart disease Brother  . Colon polyps Sister  . Thyroid cancer Neg Hx  . Colon cancer Neg Hx  . Breast cancer Neg Hx   Review of Systems   Review of Systems: The patient reports atypical, nonexertional chest pain, without shortness of breath, orthopnea, paroxysmal nocturnal dyspnea, pedal edema, with occasional palpitations, heart racing, without presyncope, syncope. Review of 12 Systems is negative except as described above.  Physical Examination   Vitals:BP (P) 118/60  Pulse (P) 70  Ht (P) 167.6 cm ( )  Wt (P) 84.8 kg (187 lb)  LMP 10/23/2015 (Approximate)  BMI (P) 30.18 kg/m2 Ht:(P) 167.6 cm ( ) Wt:(P) 84.8 kg (187 lb) ZOX:WRUE surface area is 1.99 meters squared (pended). Body mass index is 30.18 kg/(m^2) (pended).  HEENT: Pupils equally reactive to light and accomodation  Neck: Supple without thyromegaly, carotid pulses 2+ Lungs: clear to auscultation bilaterally; no wheezes, rales, rhonchi Heart: Regular rate and rhythm. No gallops, murmurs or rub Abdomen: soft nontender, nondistended, with normal bowel sounds Extremities: no cyanosis, clubbing, or edema Peripheral Pulses: 2+ in all extremities, 2+ femoral pulses bilaterally  Assessment   45 y.o. female with  1.  SOB (shortness of breath) on exertion  2. Preop cardiovascular exam  3. Chest pain with high risk for cardiac etiology, unspecified   4. Irregular tachycardia, unspecified   45 year old female referred for preoperative cardiovascular evaluation prior to carpal tunnel syndrome surgery, and evidence for ischemia on stress echocardiogram, without history of myocardial infarction, congestive heart failure, stroke, diabetes or chronic kidney disease. The patient should be at low and acceptable risk for surgery.  Plan   1. Continue current medications 2. Counseled patient about limiting her caffeine intake 3. 24-hour Holter monitor 4. Proceed with carpal tunnel syndrome surgery 5. Return to clinic after Holter monitor  Orders Placed This Encounter  Procedures  . 24 Hr Holter Monitor  . 24/48 Hr Holter Monitor-Scan/Analysis ([facility])  . 24/48 Hr Holter Monitor-Interpretation   Return in about 1 week (around 06/14/2016), or after holter.  Veronica MillardALEXANDER PARASCHOS, MD    Plan of Treatment - as of this encounter Not on file  Visit Diagnoses - in this encounter Diagnosis  SOB (shortness of breath) on exertion - Primary  Shortness of breath   Preop cardiovascular exam  Pre-operative cardiovascular examination   Chest pain with high risk for cardiac etiology, unspecified  Irregular tachycardia, unspecified  Unspecified tachycardia   Orders - in this encounter Cardiac Services Count Last Ordered Date First Ordered Date  24 HR HOLTER MONITOR, [facility]  06/07/2016   24/48 HR HOLTER MONITOR-INTERPRETATION, [facility]  06/07/2016   24/48 HR HOLTER MONITOR-SCAN/ANALYSIS, [facility]  06/07/2016   Document Information Service Providers Document Coverage Dates Jun. 30, 2017 - Jun. 30, 2017 Custodian Organization [facility] [phone] (Work) [place], [place] [address] Encounter Providers Veronica MillardAlexander Paraschos MD (Attending) [phone] (Work) [phone] (Fax)  [address] [facility] [place], [place] [address]

## 2016-06-25 NOTE — Pre-Procedure Instructions (Signed)
CARDIAC CLEARANCE FROM DR PARASCHOS-LOW RISK-IN [facility] EPIC FROM 06-07-16

## 2016-06-25 NOTE — Pre-Procedure Instructions (Signed)
Component Name Value Range  LV Ejection Fraction (%) 55   Aortic Valve Regurgitation Grade none   Aortic Valve Stenosis Grade none   Mitral Valve Regurgitation Grade mild   Mitral Valve Stenosis Grade none   Tricuspid Valve Regurgitation Grade mild    Result Narrative                     CARDIOLOGY DEPARTMENT                  Veronica [name], Veronica [name]                       [facility]                     [id number]                  [facility]                 Acct #: [id number]        [address] [name], [place] [address]       Date: 06/05/2016 08:47 AM                                                           Adult Female Age: 45 yrs                    ECHOCARDIOGRAM REPORT                  Outpatient                                                           KC::KCWC          STUDY:Stress Echo         TAPE:                  MD1:  PARASCHOS, ALEXANDER           ECHO:Yes   DOPPLER:Yes   FILE:0000-000-000      BP: 115/80 mmHg          COLOR:Yes  CONTRAST:No      MACHINE:Philips      Height: 66 in      RV BIOPSY:No         3D:No   SOUND QLTY:Moderate     Weight: 182 lb         MEDIUM:None                                       BSA: 1.9 m2  ___________________________________________________________________________________________    HISTORY:DOE, Chest pain     REASON:Assess, LV function Indication:Z01.810 Preop cardiovascular exam, R06.02 Shortness of breath, R07.9 Chest            pain, unspecified ___________________________________________________________________________________________  STRESS ECHOCARDIOGRAPHY           Protocol:Treadmill (Bruce)             Drugs:None Target Heart Rate:147  bpm        Maximum Predicted Heart Rate: 173 bpm  +-------------------+-------------------------+-------------------------+------------+        Stage            Duration (mm:ss)         Heart Rate (bpm)         BP       +-------------------+-------------------------+-------------------------+------------+       RESTING                                           66              115/80    +-------------------+-------------------------+-------------------------+------------+       EXERCISE                9:00                     164                /       +-------------------+-------------------------+-------------------------+------------+       RECOVERY                3:08                      93              131/77    +-------------------+-------------------------+-------------------------+------------+    Stress Duration:9:00 mm:ss   Max Stress H.R.:164 bpm        Target Heart Rate Achieved: Yes  ___________________________________________________________________________________________  WALL SEGMENT CHANGES                        Rest          Stress Anterior Septum Basal:Normal        Hyperkinetic                   ZOX:WRUEAV        Hyperkinetic                Apical:Normal        Hyperkinetic    Anterior Wall Basal:Normal        Hyperkinetic                   WUJ:WJXBJY        Hyperkinetic                Apical:Normal        Hyperkinetic     Lateral Wall Basal:Normal        Hyperkinetic                   NWG:NFAOZH        Hyperkinetic                Apical:Normal        Hyperkinetic   Posterior Wall Basal:Normal        Hyperkinetic                   YQM:VHQION        Hyperkinetic    Inferior Wall Basal:Normal        Hyperkinetic                   GEX:BMWUXL        Hyperkinetic  Apical:Normal        Hyperkinetic  Inferior Septum Basal:Normal        Hyperkinetic                   NWG:NFAOZH        Hyperkinetic             Resting EF:>55% (Est.)   Stress EF: >55% (Est.)  ___________________________________________________________________________________________  ADDITIONAL  FINDINGS   ___________________________________________________________________________________________  STRESS ECG RESULTS                                               ECG Results:Normal  ___________________________________________________________________________________________ ECHOCARDIOGRAPHIC DESCRIPTIONS  LEFT VENTRICLE         Size:Normal  Contraction:Normal    LV Masses:No Masses          YQM:VHQI Dias.FxClass:Normal  RIGHT VENTRICLE         Size:Normal               Free Wall:Normal  Contraction:Normal               RV Masses:No mass  PERICARDIUM        Fluid:No effusion   _______________________________________________________________________________________ DOPPLER ECHO and OTHER SPECIAL PROCEDURES    Aortic:No AR                  No AS     Mitral:MILD MR                No MS           MV Inflow E Vel=nm*    MV Annulus E'Vel=nm*           E/E'Ratio=nm*  Tricuspid:MILD TR                No TS  Pulmonary:TRIVIAL PR             No PS    ___________________________________________________________________________________________ ECHOCARDIOGRAPHIC MEASUREMENTS 2D DIMENSIONS AORTA             Values      Normal Range      MAIN PA          Values      Normal Range           Annulus:  nm*       [2.1 - 2.5]                PA Main:  nm*       [1.5 - 2.1]         Aorta Sin:  nm*       [2.7 - 3.3]       RIGHT VENTRICLE       ST Junction:  nm*       [2.3 - 2.9]                RV Base:  nm*       [ < 4.2]         Asc.Aorta:  nm*       [2.3 - 3.1]                 RV Mid:  nm*       [ < 3.5]  LEFT VENTRICLE  RV Length:  nm*       [ < 8.6]             LVIDd:  nm*       [3.9 - 5.3]       INFERIOR VENA CAVA             LVIDs:  nm*                                 Max. IVC:  nm*       [ <= 2.1]                FS:  nm*       [> 25]                    Min. IVC:  nm*               SWT:  nm*       [0.5 - 0.9]                    ------------------               PWT:  nm*       [0.5 - 0.9]                   nm* - not measured  LEFT ATRIUM           LA Diam:  nm*       [2.7 - 3.8]       LA A4C Area:  nm*       [ < 20]         LA Volume:  nm*       [22 - 52]   ___________________________________________________________________________________________ INTERPRETATION Normal Stress Echocardiogram NORMAL RIGHT VENTRICULAR SYSTOLIC FUNCTION MILD VALVULAR REGURGITATION (See above) NO VALVULAR STENOSIS NOTED   ___________________________________________________________________________________________ Electronically signed by: MD Jamse MeadAlex Paraschos on 06/07/2016 08:51 AM             Performed By: Luretha MurphyMartin, Matthew, RDCS, RVT       Ordering Physician: Marcina MillardPARASCHOS, ALEXANDER  ___________________________________________________________________________________________   Status Results Details   Encounter Summary

## 2016-07-02 NOTE — Patient Instructions (Signed)
  Your procedure is scheduled on: 07-03-16 Report to Same Day Surgery 2nd floor medical mall To find out your arrival time please call [phone] between 1PM - 3PM on 07-02-16  Remember: Instructions that are not followed completely may result in serious medical risk, up to and including death, or upon the discretion of your surgeon and anesthesiologist your surgery may need to be rescheduled.    _x___ 1. Do not eat food or drink liquids after midnight. No gum chewing or hard candies.     __x__ 2. No Alcohol for 24 hours before or after surgery.   __x__3. No Smoking for 24 prior to surgery.   ____  4. Bring all medications with you on the day of surgery if instructed.    __x__ 5. Notify your doctor if there is any change in your medical condition     (cold, fever, infections).     Do not wear jewelry, make-up, hairpins, clips or nail polish.  Do not wear lotions, powders, or perfumes. You may wear deodorant.  Do not shave 48 hours prior to surgery. Men may shave face and neck.  Do not bring valuables to the hospital.    [facility] is not responsible for any belongings or valuables.               Contacts, dentures or bridgework may not be worn into surgery.  Leave your suitcase in the car. After surgery it may be brought to your room.  For patients admitted to the hospital, discharge time is determined by your treatment team.   Patients discharged the day of surgery will not be allowed to drive home.    Please read over the following fact sheets that you were given:   [facility] Preparing for Surgery and or MRSA Information   _x___ Take these medicines the morning of surgery with A SIP OF WATER:    1. nexium  2. Take a nexium on Tuesday night before bed  3.  4.  5.  6.  ____ Fleet Enema (as directed)   _x___ Use CHG Soap or sage wipes as directed on instruction sheet   ____ Use inhalers on the day of surgery and bring to hospital day of surgery  ____ Stop metformin  2 days prior to surgery    ____ Take 1/2 of usual insulin dose the night before surgery and none on the morning of  surgery.   ____ Stop aspirin or coumadin, or plavix  _x__ Stop Anti-inflammatories such as Advil, Aleve, Ibuprofen, Motrin, Naproxen,          Naprosyn, Goodies powders or aspirin products. Ok to take Tylenol.   ____ Stop supplements until after surgery.    ____ Bring C-Pap to the hospital.

## 2016-07-03 ENCOUNTER — Ambulatory Visit
Admission: RE | Admit: 2016-07-03 | Discharge: 2016-07-03 | Disposition: A | Discharge status: Home / Self Care | Payer: BLUE CROSS/BLUE SHIELD | Source: Ambulatory Visit | Attending: Orthopedic Surgery | Admitting: Orthopedic Surgery

## 2016-07-03 ENCOUNTER — Ambulatory Visit: Payer: BLUE CROSS/BLUE SHIELD | Admitting: Anesthesiology

## 2016-07-03 ENCOUNTER — Encounter
Admission: RE | Disposition: A | Discharge status: Home / Self Care | Payer: Self-pay | Source: Ambulatory Visit | Attending: Orthopedic Surgery

## 2016-07-03 ENCOUNTER — Encounter: Payer: Self-pay | Admitting: Orthopedic Surgery

## 2016-07-03 DIAGNOSIS — K219 Gastro-esophageal reflux disease without esophagitis: Secondary | ICD-10-CM | POA: Diagnosis not present

## 2016-07-03 DIAGNOSIS — Z885 Allergy status to narcotic agent status: Secondary | ICD-10-CM | POA: Diagnosis not present

## 2016-07-03 DIAGNOSIS — Z841 Family history of disorders of kidney and ureter: Secondary | ICD-10-CM | POA: Diagnosis not present

## 2016-07-03 DIAGNOSIS — Z79891 Long term (current) use of opiate analgesic: Secondary | ICD-10-CM | POA: Diagnosis not present

## 2016-07-03 DIAGNOSIS — Z880 Allergy status to penicillin: Secondary | ICD-10-CM | POA: Diagnosis not present

## 2016-07-03 DIAGNOSIS — Z90710 Acquired absence of both cervix and uterus: Secondary | ICD-10-CM | POA: Insufficient documentation

## 2016-07-03 DIAGNOSIS — G5601 Carpal tunnel syndrome, right upper limb: Secondary | ICD-10-CM | POA: Insufficient documentation

## 2016-07-03 DIAGNOSIS — Z8249 Family history of ischemic heart disease and other diseases of the circulatory system: Secondary | ICD-10-CM | POA: Insufficient documentation

## 2016-07-03 DIAGNOSIS — Z8371 Family history of colonic polyps: Secondary | ICD-10-CM | POA: Insufficient documentation

## 2016-07-03 DIAGNOSIS — J309 Allergic rhinitis, unspecified: Secondary | ICD-10-CM | POA: Diagnosis not present

## 2016-07-03 DIAGNOSIS — Z79899 Other long term (current) drug therapy: Secondary | ICD-10-CM | POA: Diagnosis not present

## 2016-07-03 DIAGNOSIS — Z886 Allergy status to analgesic agent status: Secondary | ICD-10-CM | POA: Insufficient documentation

## 2016-07-03 DIAGNOSIS — Z9889 Other specified postprocedural states: Secondary | ICD-10-CM | POA: Diagnosis not present

## 2016-07-03 HISTORY — PX: CARPAL TUNNEL RELEASE: SHX101

## 2016-07-03 SURGERY — CARPAL TUNNEL RELEASE
Anesthesia: General | Site: Wrist | Laterality: Right | Wound class: Clean

## 2016-07-03 MED ORDER — EPHEDRINE SULFATE 50 MG/ML IJ SOLN
INTRAMUSCULAR | Status: DC | PRN
  Administered 2016-07-03: 5 mg via INTRAVENOUS

## 2016-07-03 MED ORDER — NEOMYCIN-POLYMYXIN B GU 40-200000 IR SOLN
Status: DC | PRN
  Administered 2016-07-03: 2 mL

## 2016-07-03 MED ORDER — MIDAZOLAM HCL 2 MG/2ML IJ SOLN
INTRAMUSCULAR | Status: DC | PRN
  Administered 2016-07-03: 2 mg via INTRAVENOUS

## 2016-07-03 MED ORDER — SODIUM CHLORIDE 0.9 % IV SOLN: INTRAVENOUS | Status: DC

## 2016-07-03 MED ORDER — BUPIVACAINE HCL (PF) 0.25 % IJ SOLN
INTRAMUSCULAR | Status: AC
  Filled 2016-07-03: qty 30

## 2016-07-03 MED ORDER — LACTATED RINGERS IV SOLN
INTRAVENOUS | Status: DC | PRN
  Administered 2016-07-03 (×2): via INTRAVENOUS

## 2016-07-03 MED ORDER — NEOMYCIN-POLYMYXIN B GU 40-200000 IR SOLN
Status: AC
  Filled 2016-07-03: qty 2

## 2016-07-03 MED ORDER — ONDANSETRON HCL 4 MG PO TABS: 4.0000 mg | ORAL_TABLET | Freq: Four times a day (QID) | ORAL | Status: DC | PRN

## 2016-07-03 MED ORDER — BUPIVACAINE HCL (PF) 0.25 % IJ SOLN
INTRAMUSCULAR | Status: DC | PRN
  Administered 2016-07-03: 6 mL

## 2016-07-03 MED ORDER — METOCLOPRAMIDE HCL 10 MG PO TABS: 5.0000 mg | ORAL_TABLET | Freq: Three times a day (TID) | ORAL | Status: DC | PRN

## 2016-07-03 MED ORDER — ACETAMINOPHEN 10 MG/ML IV SOLN
INTRAVENOUS | Status: AC
  Filled 2016-07-03: qty 100

## 2016-07-03 MED ORDER — ONDANSETRON HCL 4 MG/2ML IJ SOLN: 4.0000 mg | Freq: Once | INTRAMUSCULAR | Status: DC | PRN

## 2016-07-03 MED ORDER — SCOPOLAMINE 1 MG/3DAYS TD PT72
1.0000 | MEDICATED_PATCH | Freq: Once | TRANSDERMAL | Status: DC
  Administered 2016-07-03: 1.5 mg via TRANSDERMAL

## 2016-07-03 MED ORDER — FENTANYL CITRATE (PF) 100 MCG/2ML IJ SOLN
25.0000 ug | INTRAMUSCULAR | Status: DC | PRN
  Administered 2016-07-03 (×3): 25 ug via INTRAVENOUS

## 2016-07-03 MED ORDER — FENTANYL CITRATE (PF) 100 MCG/2ML IJ SOLN
INTRAMUSCULAR | Status: AC
  Administered 2016-07-03: 25 ug via INTRAVENOUS
  Filled 2016-07-03: qty 2

## 2016-07-03 MED ORDER — METOCLOPRAMIDE HCL 5 MG/ML IJ SOLN: 5.0000 mg | Freq: Three times a day (TID) | INTRAMUSCULAR | Status: DC | PRN

## 2016-07-03 MED ORDER — ONDANSETRON HCL 4 MG/2ML IJ SOLN: 4.0000 mg | Freq: Four times a day (QID) | INTRAMUSCULAR | Status: DC | PRN

## 2016-07-03 MED ORDER — HYDROCODONE-ACETAMINOPHEN 5-325 MG PO TABS: 1.0000 | ORAL_TABLET | ORAL | Status: DC | PRN

## 2016-07-03 MED ORDER — HYDROCODONE-ACETAMINOPHEN 5-325 MG PO TABS: 1.0000 | ORAL_TABLET | ORAL | 0 refills | Status: DC | PRN

## 2016-07-03 MED ORDER — LACTATED RINGERS IV SOLN
INTRAVENOUS | Status: DC
  Administered 2016-07-03: 15:00:00 via INTRAVENOUS

## 2016-07-03 MED ORDER — SCOPOLAMINE 1 MG/3DAYS TD PT72
MEDICATED_PATCH | TRANSDERMAL | Status: AC
  Administered 2016-07-03: 1.5 mg via TRANSDERMAL
  Filled 2016-07-03: qty 1

## 2016-07-03 MED ORDER — LIDOCAINE HCL (CARDIAC) 20 MG/ML IV SOLN
INTRAVENOUS | Status: DC | PRN
  Administered 2016-07-03: 60 mg via INTRAVENOUS

## 2016-07-03 MED ORDER — PROPOFOL 10 MG/ML IV BOLUS
INTRAVENOUS | Status: DC | PRN
  Administered 2016-07-03: 180 mg via INTRAVENOUS
  Administered 2016-07-03: 20 mg via INTRAVENOUS

## 2016-07-03 MED ORDER — ONDANSETRON HCL 4 MG/2ML IJ SOLN
INTRAMUSCULAR | Status: DC | PRN
  Administered 2016-07-03: 4 mg via INTRAVENOUS

## 2016-07-03 MED ORDER — CHLORHEXIDINE GLUCONATE 4 % EX LIQD: 60.0000 mL | Freq: Once | CUTANEOUS | Status: DC

## 2016-07-03 MED ORDER — FENTANYL CITRATE (PF) 100 MCG/2ML IJ SOLN
INTRAMUSCULAR | Status: DC | PRN
  Administered 2016-07-03 (×2): 50 ug via INTRAVENOUS

## 2016-07-03 MED ORDER — DEXAMETHASONE SODIUM PHOSPHATE 4 MG/ML IJ SOLN
INTRAMUSCULAR | Status: DC | PRN
  Administered 2016-07-03: 5 mg via INTRAVENOUS

## 2016-07-03 SURGICAL SUPPLY — 26 items
BANDAGE ELASTIC 3 LF NS (GAUZE/BANDAGES/DRESSINGS) ×2 IMPLANT
BLADE SURG 15 STRL LF DISP TIS (BLADE) ×1 IMPLANT
BLADE SURG 15 STRL SS (BLADE) ×1
BNDG ESMARK 4X12 TAN STRL LF (GAUZE/BANDAGES/DRESSINGS) ×2 IMPLANT
CANISTER SUCT 1200ML W/VALVE (MISCELLANEOUS) ×2 IMPLANT
CAST PADDING 3X4FT ST 30246 (SOFTGOODS) ×1
CUFF TOURN 18 STER (MISCELLANEOUS) ×2 IMPLANT
DRSG DERMACEA 8X12 NADH (GAUZE/BANDAGES/DRESSINGS) ×2 IMPLANT
DURAPREP 26ML APPLICATOR (WOUND CARE) ×2 IMPLANT
ELECT CAUTERY BLADE 6.4 (BLADE) ×2 IMPLANT
ELECT REM PT RETURN 9FT ADLT (ELECTROSURGICAL) ×2
ELECTRODE REM PT RTRN 9FT ADLT (ELECTROSURGICAL) ×1 IMPLANT
GAUZE SPONGE 4X4 12PLY STRL (GAUZE/BANDAGES/DRESSINGS) ×2 IMPLANT
GLOVE BIOGEL M STRL SZ7.5 (GLOVE) ×2 IMPLANT
GLOVE INDICATOR 8.0 STRL GRN (GLOVE) ×2 IMPLANT
GOWN STRL REUS W/ TWL LRG LVL3 (GOWN DISPOSABLE) ×2 IMPLANT
GOWN STRL REUS W/TWL LRG LVL3 (GOWN DISPOSABLE) ×2
KIT RM TURNOVER STRD PROC AR (KITS) ×2 IMPLANT
NS IRRIG 500ML POUR BTL (IV SOLUTION) ×2 IMPLANT
PACK EXTREMITY ARMC (MISCELLANEOUS) ×2 IMPLANT
PAD CAST CTTN 3X4 STRL (SOFTGOODS) ×1 IMPLANT
SOL PREP PVP 2OZ (MISCELLANEOUS) ×2
SOLUTION PREP PVP 2OZ (MISCELLANEOUS) ×1 IMPLANT
SPLINT CAST 1 STEP 3X12 (MISCELLANEOUS) ×2 IMPLANT
STOCKINETTE STRL 4IN 9604848 (GAUZE/BANDAGES/DRESSINGS) ×2 IMPLANT
SUT ETHILON 5-0 FS-2 18 BLK (SUTURE) ×2 IMPLANT

## 2016-07-03 NOTE — OR Nursing (Signed)
IV left arm dcd cath intact site clear

## 2016-07-03 NOTE — Anesthesia Postprocedure Evaluation (Signed)
Anesthesia Post Note  Patient: Veronica [name]  Procedure(s) Performed: Procedure(s) (LRB): CARPAL TUNNEL RELEASE (Right)  Patient location during evaluation: PACU Anesthesia Type: General Level of consciousness: awake and alert Pain management: pain level controlled Vital Signs Assessment: post-procedure vital signs reviewed and stable Respiratory status: spontaneous breathing, nonlabored ventilation, respiratory function stable and patient connected to nasal cannula oxygen Cardiovascular status: blood pressure returned to baseline and stable Postop Assessment: no signs of nausea or vomiting Anesthetic complications: no    Last Vitals:  Vitals:   07/03/16 1751 07/03/16 1839  BP: 124/81 132/84  Pulse: 62 68  Resp: 18 18  Temp: 36.9 C     Last Pain:  Vitals:   07/03/16 1839  TempSrc:   PainSc: 4                  Lenard Simmer

## 2016-07-03 NOTE — Transfer of Care (Signed)
Immediate Anesthesia Transfer of Care Note  Patient: Veronica [name]  Procedure(s) Performed: Procedure(s): CARPAL TUNNEL RELEASE (Right)  Patient Location: PACU  Anesthesia Type:General  Level of Consciousness: awake and patient cooperative  Airway & Oxygen Therapy: Patient Spontanous Breathing and Patient connected to face mask oxygen  Post-op Assessment: Report given to RN and Post -op Vital signs reviewed and stable  Post vital signs: Reviewed and stable  Last Vitals:  Vitals:   07/03/16 1426 07/03/16 1658  BP: 118/83 130/84  Pulse: 60 64  Resp: 16 16  Temp: 36.8 C 36.3 C    Last Pain:  Vitals:   07/03/16 1426  TempSrc: Oral         Complications: No apparent anesthesia complications

## 2016-07-03 NOTE — Op Note (Signed)
OPERATIVE NOTE  DATE OF SURGERY:  07/03/2016  PATIENT NAME:  Veronica [name]   DOB: 07-10-1971  MRN: [id number]  PRE-OPERATIVE DIAGNOSIS: Right carpal tunnel syndrome  POST-OPERATIVE DIAGNOSIS:  Same  PROCEDURE:  Right carpal tunnel release  SURGEON:  Jena Gauss. M.D.  ANESTHESIA: general  ESTIMATED BLOOD LOSS: Minimal  FLUIDS REPLACED: 1000 mL of crystalloid  TOURNIQUET TIME: 26 minutes  DRAINS: None  INDICATIONS FOR SURGERY: Veronica [name] is a 45 y.o. year old female with a long history of numbness and paresthesias to the right hand. EMG/nerve conduction studies demonstrated findings consistent with carpal tunnel syndrome.The patient had not seen any significant improvement despite conservative nonsurgical intervention. After discussion of the risks and benefits of surgical intervention, the patient expressed understanding of the risks benefits and agree with plans for carpal tunnel release.   PROCEDURE IN DETAIL: The patient was brought into the operating room and after adequate general anesthesia, a tourniquet was placed on the patient's right upper arm.The right hand and arm were prepped with alcohol and Duraprep and draped in the usual sterile fashion. A "time-out" was performed as per usual protocol. The hand and forearm were exsanguinated using an Esmarch and the tourniquet was inflated to 250 mmHg. Loupe magnification was used throughout the procedure. An incision was made just ulnar to the thenar palmar crease. Dissection was carried down through the palmar fascia to the transverse carpal ligament. The transverse carpal ligament was sharply incised, taking care to protect the underlying structures with the carpal tunnel. Complete release of the transverse carpal ligament was achieved. There was no evidence of ganglion cyst or lipoma within the carpal tunnel. The wound was irrigated with copious amounts of normal saline with antibiotic solution. The skin was then  re-approximated with interrupted sutures of #5-0 nylon. A sterile dressing was applied followed by application of a volar splint. The tourniquet was deflated with a total tourniquet time of 26 minutes.  The patient tolerated the procedure well and was transported to the PACU in stable condition.  Ichelle Harral P. Angie Fava., M.D.

## 2016-07-03 NOTE — Anesthesia Preprocedure Evaluation (Signed)
Anesthesia Evaluation  Patient identified by MRN, date of birth, ID band Patient awake    Reviewed: Allergy & Precautions, H&P , NPO status , Patient's Chart, lab work & pertinent test results, reviewed documented beta blocker date and time   History of Anesthesia Complications (+) PONV and history of anesthetic complications  Airway Mallampati: II  TM Distance: >3 FB Neck ROM: full    Dental no notable dental hx.    Pulmonary neg pulmonary ROS,    Pulmonary exam normal breath sounds clear to auscultation       Cardiovascular Exercise Tolerance: Good negative cardio ROS   Rhythm:regular Rate:Normal     Neuro/Psych  Headaches, R upper extremity pain from pinched nerve in neck negative psych ROS   GI/Hepatic Neg liver ROS, GERD  Medicated,  Endo/Other  negative endocrine ROS  Renal/GU negative Renal ROS  negative genitourinary   Musculoskeletal  (+) Arthritis , Osteoarthritis,    Abdominal Normal abdominal exam  (+)   Peds negative pediatric ROS (+)  Hematology negative hematology ROS (+)   Anesthesia Other Findings   Reproductive/Obstetrics negative OB ROS                            Anesthesia Physical  Anesthesia Plan  ASA: II  Anesthesia Plan: General   Post-op Pain Management:    Induction: Intravenous  Airway Management Planned: LMA  Additional Equipment:   Intra-op Plan:   Post-operative Plan: Extubation in OR  Informed Consent: I have reviewed the patients History and Physical, chart, labs and discussed the procedure including the risks, benefits and alternatives for the proposed anesthesia with the patient or authorized representative who has indicated his/her understanding and acceptance.   Dental advisory given  Plan Discussed with: CRNA and Surgeon  Anesthesia Plan Comments:        Anesthesia Quick Evaluation

## 2016-07-03 NOTE — Anesthesia Procedure Notes (Signed)
Procedure Name: LMA Insertion Date/Time: 07/03/2016 4:04 PM Performed by: Lily Kocher Pre-anesthesia Checklist: Patient identified, Patient being monitored, Timeout performed, Emergency Drugs available and Suction available Patient Re-evaluated:Patient Re-evaluated prior to inductionOxygen Delivery Method: Circle system utilized Preoxygenation: Pre-oxygenation with 100% oxygen Intubation Type: IV induction Ventilation: Mask ventilation without difficulty LMA: LMA inserted LMA Size: 4.0 Tube type: Oral Number of attempts: 1 Placement Confirmation: positive ETCO2 and breath sounds checked- equal and bilateral Tube secured with: Tape Dental Injury: Teeth and Oropharynx as per pre-operative assessment

## 2016-07-03 NOTE — Brief Op Note (Signed)
07/03/2016  5:07 PM  PATIENT:  Veronica [name]  45 y.o. female  PRE-OPERATIVE DIAGNOSIS:  CARPAL TUNNEL SYNDROME, right  POST-OPERATIVE DIAGNOSIS:  Same  PROCEDURE:  Procedure(s): CARPAL TUNNEL RELEASE (Right)  SURGEON:  Surgeon(s) and Role:    * Donato Heinz, MD - Primary  ASSISTANTS: none   ANESTHESIA:   general  EBL:  Total I/O In: 1000 [I.V.:1000] Out: 2 [Blood:2]  BLOOD ADMINISTERED:none  DRAINS: none   LOCAL MEDICATIONS USED:  MARCAINE     SPECIMEN:  No Specimen  DISPOSITION OF SPECIMEN:  N/A  COUNTS:  YES  TOURNIQUET:   26 minutes  DICTATION: .Dragon Dictation  PLAN OF CARE: Discharge to home after PACU  PATIENT DISPOSITION:  PACU - hemodynamically stable.   Delay start of Pharmacological VTE agent (>24hrs) due to surgical blood loss or risk of bleeding: not applicable

## 2016-07-03 NOTE — Discharge Instructions (Signed)
AMBULATORY SURGERY  °DISCHARGE INSTRUCTIONS ° ° °1) The drugs that you were given will stay in your system until tomorrow so for the next 24 hours you should not: ° °A) Drive an automobile °B) Make any legal decisions °C) Drink any alcoholic beverage ° ° °2) You may resume regular meals tomorrow.  Today it is better to start with liquids and gradually work up to solid foods. ° °You may eat anything you prefer, but it is better to start with liquids, then soup and crackers, and gradually work up to solid foods. ° ° °3) Please notify your doctor immediately if you have any unusual bleeding, trouble breathing, redness and pain at the surgery site, drainage, fever, or pain not relieved by medication. ° ° ° °4) Additional Instructions: ° ° ° ° ° ° ° °Please contact your physician with any problems or Same Day Surgery at [phone], Monday through Friday 6 am to 4 pm, or [place] [facility] [place] [facility] number at [phone]. ° °Instructions after Hand / Wrist Surgery ° ° James P. Hooten, Jr., M.D. ° Dept. of Orthopaedics & Sports Medicine ° [facility] ° [address] ° [place], [place]  [address] ° ° Phone: [phone]   Fax: [phone] ° ° °DIET: °• Drink plenty of non-alcoholic fluids & begin a light diet. °• Resume your normal diet the day after surgery. ° °ACTIVITY:  °• Keep the hand elevated above the level of the elbow. °• Begin gently moving the fingers on a regular basis to avoid stiffness. °• Avoid any heavy lifting, pushing, or pulling with the operative hand. °• Do not drive or operate any equipment until instructed. ° °WOUND CARE:  °• Keep the splint/bandage clean and dry.  °• The splint and stitches will be removed in the office. °• Continue to use the ice packs periodically to reduce pain and swelling. °• You may bathe or shower after the stitches are removed at the first office visit following surgery. ° °MEDICATIONS: °• You may resume your regular medications. °• Please take the pain medication  as prescribed. °• Do not take pain medication on an empty stomach. °• Do not drive or drink alcoholic beverages when taking pain medications. ° °CALL THE OFFICE FOR: °• Temperature above 101 degrees °• Excessive bleeding or drainage on the dressing. °• Excessive swelling, coldness, or paleness of the fingers. °• Persistent nausea and vomiting. ° °FOLLOW-UP:  °• You should have an appointment to return to the office in 7-10 days after surgery.  ° °REMEMBER: R.I.C.E. = Rest, Ice, Compression, Elevation !  °

## 2016-07-03 NOTE — H&P (Signed)
The patient has been re-examined, and the chart reviewed, and there have been no interval changes to the documented history and physical.    The risks, benefits, and alternatives have been discussed at length. The patient expressed understanding of the risks benefits and agreed with plans for surgical intervention.  James P. Hooten, Jr. M.D.    

## 2016-07-04 ENCOUNTER — Encounter: Payer: Self-pay | Admitting: Orthopedic Surgery

## 2016-10-25 ENCOUNTER — Encounter
Admission: RE | Admit: 2016-10-25 | Discharge: 2016-10-25 | Disposition: A | Discharge status: Home / Self Care | Payer: BLUE CROSS/BLUE SHIELD | Source: Ambulatory Visit | Attending: Orthopedic Surgery | Admitting: Orthopedic Surgery

## 2016-10-25 DIAGNOSIS — Z01810 Encounter for preprocedural cardiovascular examination: Secondary | ICD-10-CM | POA: Diagnosis present

## 2016-10-25 DIAGNOSIS — R079 Chest pain, unspecified: Secondary | ICD-10-CM | POA: Insufficient documentation

## 2016-10-25 NOTE — Patient Instructions (Signed)
  Your procedure is scheduled on: 11-04-16 [facility](MONDAY) Report to Same Day Surgery 2nd floor medical mall To find out your arrival time please call [phone] between 1PM - 3PM on 11-01-16 (FRIDAY)  Remember: Instructions that are not followed completely may result in serious medical risk, up to and including death, or upon the discretion of your surgeon and anesthesiologist your surgery may need to be rescheduled.    _x___ 1. Do not eat food or drink liquids after midnight. No gum chewing or hard candies.     __x__ 2. No Alcohol for 24 hours before or after surgery.   __x__3. No Smoking for 24 prior to surgery.   ____  4. Bring all medications with you on the day of surgery if instructed.    __x__ 5. Notify your doctor if there is any change in your medical condition     (cold, fever, infections).     Do not wear jewelry, make-up, hairpins, clips or nail polish.  Do not wear lotions, powders, or perfumes. You may wear deodorant.  Do not shave 48 hours prior to surgery. Men may shave face and neck.  Do not bring valuables to the hospital.    [facility] is not responsible for any belongings or valuables.               Contacts, dentures or bridgework may not be worn into surgery.  Leave your suitcase in the car. After surgery it may be brought to your room.  For patients admitted to the hospital, discharge time is determined by your treatment team.   Patients discharged the day of surgery will not be allowed to drive home.    Please read over the following fact sheets that you were given:   [facility] Preparing for Surgery and or MRSA Information   _x___ Take these medicines the morning of surgery with A SIP OF WATER:    1. MAGNESIUM  2. NEXIUM  3. TAKE A NEXIUM Sunday NIGHT BEFORE BED (11-03-16)  4.  5.  6.  ____Fleets enema or Magnesium Citrate as directed.   _x___ Use CHG Soap or sage wipes as directed on instruction sheet   ____ Use inhalers on the day of surgery and  bring to hospital day of surgery  ____ Stop metformin 2 days prior to surgery    ____ Take 1/2 of usual insulin dose the night before surgery and none on the morning of surgery.   ____ Stop aspirin or coumadin, or plavix  x__ Stop Anti-inflammatories such as Advil, Aleve, Ibuprofen, Motrin, Naproxen,          Naprosyn, Goodies powders or aspirin products 7 DAYS PRIOR TO SURGERY-Ok to take Tylenol, HYDROCODONE OR TRAMADOL IF NEEDED   _X___ Stop supplements until after surgery-STOP MELATONIN 7 DAYS PRIOR TO SURGERY  ____ Bring C-Pap to the hospital.

## 2016-10-25 NOTE — Pre-Procedure Instructions (Signed)
Veronica [facility]28/2017 [facility] Component Name Value Ref Range  LV Ejection Fraction (%) 55   Aortic Valve Regurgitation Grade none   Aortic Valve Stenosis Grade none   Mitral Valve Regurgitation Grade mild   Mitral Valve Stenosis Grade none   Tricuspid Valve Regurgitation Grade mild   Result Narrative   CARDIOLOGY Veronica KaufmannDEPARTMENTMILLER, Veronica [name] CLINIC [id number]  [facility] Acct #: [id number]  [address], Veronica [name], [name] Date: 06/05/2016 08:47 AM Veronica [name] Age: 45 yrs  Veronica REPORTOutpatient KC::KCWC  STUDY:Stress Echo TAPE:MD1:[name], ALEXANDER ECHO:Yes DOPPLER:Yes FILE:0000-000-000BP: 115/80 mmHg  COLOR:YesCONTRAST:NoMACHINE:PhilipsHeight: 66 in  RV BIOPSY:No 3D:No SOUND QLTY:Moderate Weight: 182 lb MEDIUM:None BSA: 1.9 m2  ___________________________________________________________________________________________  HISTORY:DOE, Chest pain REASON:Assess, LV function Indication:Z01.810 Preop cardiovascular exam, R06.02 Shortness of breath, R07.9 Chest  pain, unspecified ___________________________________________________________________________________________  STRESS ECHOCARDIOGRAPHY   Protocol:Treadmill (Bruce) Drugs:None Target Heart Rate:147 bpmMaximum Predicted Heart Rate: 173 bpm  +-------------------+-------------------------+-------------------------+------------+  Stage  Duration (mm:ss) Heart Rate  (bpm) BP  +-------------------+-------------------------+-------------------------+------------+ RESTING 66  115/80  +-------------------+-------------------------+-------------------------+------------+ EXERCISE  9:00 164  / +-------------------+-------------------------+-------------------------+------------+ RECOVERY  [id number]  131/77  +-------------------+-------------------------+-------------------------+------------+  Stress Duration:9:00 mm:ss Max Stress H.R.:164 bpmTarget Heart Rate Achieved: Yes  ___________________________________________________________________________________________  WALL SEGMENT CHANGES  RestStress Anterior Septum Basal:NormalHyperkinetic JYN:[id number]:NormalHyperkinetic  Apical:NormalHyperkinetic  Anterior Wall Basal:NormalHyperkinetic EXB:MWUXLKGMWNUUVOZDGUid:NormalHyperkinetic  Apical:NormalHyperkinetic   Lateral Wall Basal:NormalHyperkinetic YQI:HKVQQVZDGLOVFIEPPIid:NormalHyperkinetic  Apical:NormalHyperkinetic   Posterior Wall Basal:NormalHyperkinetic RJJ:OACZYSAYTKZSWFUXNAid:NormalHyperkinetic  Inferior Wall Basal:NormalHyperkinetic TFT:DDUKGURKYHCWCBJSEGid:NormalHyperkinetic  Apical:NormalHyperkinetic  Inferior Septum Basal:NormalHyperkinetic BTD:VVOHYWVPXTGGYIRSWNid:NormalHyperkinetic   Resting EF:>55% (Est.) Stress EF: >55% (Est.)  ___________________________________________________________________________________________  ADDITIONAL  FINDINGS   ___________________________________________________________________________________________  STRESS ECG RESULTS   ECG Results:Normal  ___________________________________________________________________________________________ ECHOCARDIOGRAPHIC DESCRIPTIONS  LEFT VENTRICLE Size:Normal  Contraction:Normal  LV Masses:No Masses  IOE:VOJJLVH:None Dias.FxClass:Normal  RIGHT VENTRICLE Size:Normal Free Wall:Normal  Contraction:Normal RV Masses:No mass  PERICARDIUM  Fluid:No effusion   _______________________________________________________________________________________ DOPPLER ECHO and OTHER SPECIAL PROCEDURES  Aortic:No ARNo AS   Mitral:MILD MRNo MS MV Inflow E Vel=nm*MV Annulus E'Vel=nm* E/E'Ratio=nm*  Tricuspid:MILD TRNo TS  Pulmonary:TRIVIAL PR No PS    ___________________________________________________________________________________________ ECHOCARDIOGRAPHIC MEASUREMENTS 2D DIMENSIONS AORTA ValuesNormal RangeMAIN PAValuesNormal Range Annulus:nm* [2.1 - 2.5]PA Main:nm* [1.5 - 2.1] Aorta Sin:nm* [2.7 - 3.3] RIGHT VENTRICLE ST Junction:nm* [2.3 - 2.9]RV Base:nm* [ < 4.2] Asc.Aorta:nm* [2.3 - 3.1] RV Mid:nm* [ < 3.5]  LEFT VENTRICLERV Length:nm* [ < 8.6] LVIDd:nm* [3.9 - 5.3] INFERIOR VENA CAVA LVIDs:nm* Max. IVC:nm* [ <= 2.1]  FS:nm* [> 25]Min. IVC:nm* SWT:nm* [0.5 - 0.9]  ------------------ PWT:nm* [0.5 - 0.9] nm* - not measured  LEFT ATRIUM LA Diam:nm* [2.7 - 3.8] LA A4C Area:nm* [ < 20] LA Volume:nm* [22 - 52]   ___________________________________________________________________________________________ INTERPRETATION Normal Stress Veronica NORMAL RIGHT VENTRICULAR SYSTOLIC FUNCTION MILD VALVULAR REGURGITATION (See above) NO VALVULAR STENOSIS NOTED   ___________________________________________________________________________________________ Electronically signed by: MD Veronica MeadAlex [name] on 06/07/2016 08:51 AM Performed By: Veronica [name], Veronica [name], RDCS, RVT Ordering Physician: Veronica MillardPARASCHOS, ALEXANDER  ___________________________________________________________________________________________  Status Results Details    [facility]")' [email or web address]/">Encounter Summary

## 2016-10-25 NOTE — Pre-Procedure Instructions (Signed)
Veronica [name], Alexander, MD - 07/08/2016 11:30 AM EDT Formatting of this note may be different from the original. Established Patient Visit   Chief Complaint: Chief Complaint  Patient presents with  . Follow-up  holter / echo  Date of Service: 07/08/2016 Date of Birth: 10-05-1971 PCP: Veronica [name]  History of Present Illness: [name] is a 45 y.o.female patient who returns for evaluation of chest pain, and palpitations. Patient initially seen for preoperative evaluation prior to carpal tunnel syndrome surgery which was performed last week without complication. Patient denies chest pain currently. She has occasional palpitations. 24-hour Holter monitor was performed 06/07/2016 which revealed predominant normal sinus rhythm, sinus tachycardia, sinus arrhythmia with occasional PVCs. Stress echocardiogram 06/05/44 year old normal left ventricular function without evidence for ischemia.  Past Medical and Surgical History  Past Medical History Past Medical History:  Diagnosis Date  . Allergic rhinitis  . Carpal tunnel syndrome, right  . Chicken pox  . Cyst of thyroid 11/20/2015  . Female stress incontinence 04/19/2013  . Gastroesophageal reflux 11/20/2015  . Spinal stenosis of cervical region 11/09/2013  . Urge incontinence 04/19/2013   Past Surgical History She has a past surgical history that includes Tubal ligation; Tonsillectomy; Arthroscopy Shoulder Repair Slap Lesion; LABRUM SURGERY (Right); NECK SURGERY; Repair Rectocele (03/18/2014); Endometrial ablation w/ novasure (03/18/2014); Hysterectomy (10/23/2015); Colonoscopy (01/02/2016); egd (01/02/2016); and Right carpal tunnel release (07/03/2016).   Medications and Allergies  Current Medications  Current Outpatient Prescriptions  Medication Sig Dispense Refill  . cetirizine (ZYRTEC) 10 MG tablet Take by mouth.  . dextroamphetamine-amphetamine (ADDERALL) 20 mg tablet Take 20 mg by mouth every morning.   Marland Kitchen. esomeprazole (NEXIUM) 40 MG DR  capsule Take 1 capsule (40 mg total) by mouth every morning. For reflux 30 capsule 5  . HYDROcodone-acetaminophen (NORCO) 5-325 mg tablet TK 1-2 TS PO Q 4 H PRN FOR MODERATE PAIN 0  . loratadine (CLARITIN) 10 mg tablet Take 10 mg by mouth once daily.  . naproxen sodium (ALEVE, ANAPROX) 220 MG tablet Take 220 mg by mouth 2 (two) times daily as needed.   . traMADol (ULTRAM) 50 mg tablet Take 1-2 tablets (50-100 mg total) by mouth every 4 (four) hours while awake. 60 tablet 0   No current facility-administered medications for this visit.   Allergies: Acetaminophen; Amoxicillin; Penicillins; and Oxycodone  Social and Family History  Social History reports that she has never smoked. She has never used smokeless tobacco. She reports that she does not drink alcohol or use illicit drugs.  Family History Family History  Problem Relation Age of Onset  . Kidney disease Mother  . Heart disease Mother  . Heart disease Father  . Heart attack Father  . Heart disease Sister  . Heart disease Brother  . Colon polyps Sister  . Thyroid cancer Neg Hx  . Colon cancer Neg Hx  . Breast cancer Neg Hx   Review of Systems   Review of Systems: The patient denies chest pain, shortness of breath, orthopnea, paroxysmal nocturnal dyspnea, pedal edema, reports occasional palpitations, heart racing, without presyncope, syncope. Review of 12 Systems is negative except as described above.  Physical Examination   Vitals:BP (P) 106/62  Pulse (P) 80  Ht (P) 167.6 cm (5\' 6" )  Wt (P) 82.6 kg (182 lb)  LMP 10/23/2015 (Approximate)  BMI (P) 29.38 kg/m2 Ht:(P) 167.6 cm (5\' 6" ) Wt:(P) 82.6 kg (182 lb) ZOX:WRUEBSA:Body surface area is 1.96 meters squared (pended). Body mass index is 29.38 kg/(m^2) (pended).  HEENT: Pupils equally reactive to  light and accomodation  Neck: Supple without thyromegaly, carotid pulses 2+ Lungs: clear to auscultation bilaterally; no wheezes, rales, rhonchi Heart: Regular rate and rhythm. No  gallops, murmurs or rub Abdomen: soft nontender, nondistended, with normal bowel sounds Extremities: no cyanosis, clubbing, or edema Peripheral Pulses: 2+ in all extremities, 2+ femoral pulses bilaterally  Assessment   45 y.o. female with  1. SOB (shortness of breath) on exertion  2. Preop cardiovascular exam  3. Chest pain with high risk for cardiac etiology  4. Abnormal ECG   45 year old female intermittent chest pain, without recurrence, with normal stress echocardiogram. The patient has occasional palpitations, 24 hour Holter monitor revealing PVCs.  Plan   1. Continue current medications 2. Counseled patient about low-sodium diet 3. Counseled patient about low-cholesterol diet 4. Counseled patient about limiting her caffeine intake 5. Defer empiric beta-blocker therapy at this time 6. Encouraged patient to continue to lose weight 7. Encourage patient to exercise regular 8. Return to clinic for follow-up in 6 months  No orders of the defined types were placed in this encounter.  Return in about 6 months (around 01/08/2017).  Veronica MillardALEXANDER PARASCHOS, MD

## 2016-10-28 NOTE — Pre-Procedure Instructions (Signed)
During phone interview on Friday, 10-25-16, pt states she still has same mid sternal chest pain/twinge intemittent and sob with exertion  that she had back in July and had to get cardiac clearance for her carpal tunnel surgery then. Dr Darrold JunkerParaschos did stress test and cleared pt for her surgery. Pt states nothing has changed. Pt now having carpal tunnel surgery on the other hand. I faxed over to Dr Darrold JunkerParaschos office a note stating that pt is still having the exact same symptoms as she was back in July and that he cleared pt for that surgery.  Is she ok to proceed with this upcoming carpal tunnel surgery on 11-04-16.  Faxed over to Dr Darrold JunkerParaschos office and received fax confirmation.  Also called over to Dr Darrold JunkerParaschos office and spoke with Elease HashimotoPatricia and informed her I had faxed this over so she could be looking for it. She verbalized that she would.

## 2016-10-28 NOTE — Pre-Procedure Instructions (Signed)
Spoke with Tiffany at Dr Dorna BloomHootens office and informed her of the info I had faxed over to Dr Darrold JunkerParaschos office so she could keep an eye on it.  Tiffany verbalized that she would

## 2016-10-30 ENCOUNTER — Encounter: Payer: Self-pay | Admitting: *Deleted

## 2016-10-30 NOTE — Pre-Procedure Instructions (Signed)
SPOKE WITH SANDY AT DR PARASCHOS OFFICE TO SEE IF DR PARASCHOS HAS GIVEN THE OK FOR PT TO HAVE HER CTR- SANDY SAID THEY WERE SO BUSY YESTERDAY THAT DR PARASCHOS DID NOT LOOK AT PAPERWORK OR EKG THAT I FAXED OVER ON Monday.  SHE SAID SHE WOULD GET HIM TO LOOK AT IT TODAY. I TOLD HER THAT WE WILL NOT BE IN OFFICE TOMORROW OR Friday AND PT IS HAVING HER SURGERY ON Monday.  I GAVE SANDY MY DIRECT # AND SHE SAID SHE WOULD CALL ME AS SOON AS SHE FOUND SOMETHING OUT.

## 2016-10-30 NOTE — Pre-Procedure Instructions (Signed)
RECEIVED CARDIAC CLEARANCE NOTE FROM DR PARASCHOS-LOW RISK-ON CHART

## 2016-11-04 ENCOUNTER — Ambulatory Visit
Admission: RE | Admit: 2016-11-04 | Discharge: 2016-11-04 | Disposition: A | Discharge status: Home / Self Care | Payer: BLUE CROSS/BLUE SHIELD | Source: Ambulatory Visit | Attending: Orthopedic Surgery | Admitting: Orthopedic Surgery

## 2016-11-04 ENCOUNTER — Encounter: Payer: Self-pay | Admitting: Anesthesiology

## 2016-11-04 ENCOUNTER — Ambulatory Visit: Payer: BLUE CROSS/BLUE SHIELD | Admitting: Anesthesiology

## 2016-11-04 ENCOUNTER — Encounter
Admission: RE | Disposition: A | Discharge status: Home / Self Care | Payer: Self-pay | Source: Ambulatory Visit | Attending: Orthopedic Surgery

## 2016-11-04 DIAGNOSIS — G5602 Carpal tunnel syndrome, left upper limb: Secondary | ICD-10-CM | POA: Insufficient documentation

## 2016-11-04 DIAGNOSIS — K219 Gastro-esophageal reflux disease without esophagitis: Secondary | ICD-10-CM | POA: Insufficient documentation

## 2016-11-04 HISTORY — DX: Dyspnea, unspecified: R06.00

## 2016-11-04 HISTORY — PX: CARPAL TUNNEL RELEASE: SHX101

## 2016-11-04 SURGERY — CARPAL TUNNEL RELEASE
Anesthesia: General | Site: Hand | Laterality: Left | Wound class: Clean

## 2016-11-04 MED ORDER — NEOMYCIN-POLYMYXIN B GU 40-200000 IR SOLN
Status: AC
  Filled 2016-11-04: qty 2

## 2016-11-04 MED ORDER — BUPIVACAINE HCL (PF) 0.25 % IJ SOLN
INTRAMUSCULAR | Status: DC | PRN
  Administered 2016-11-04: 10 mL

## 2016-11-04 MED ORDER — MIDAZOLAM HCL 2 MG/2ML IJ SOLN
INTRAMUSCULAR | Status: DC | PRN
  Administered 2016-11-04: 2 mg via INTRAVENOUS

## 2016-11-04 MED ORDER — FENTANYL CITRATE (PF) 100 MCG/2ML IJ SOLN
INTRAMUSCULAR | Status: AC
  Administered 2016-11-04: 25 ug via INTRAVENOUS
  Filled 2016-11-04: qty 2

## 2016-11-04 MED ORDER — SODIUM CHLORIDE 0.9 % IV SOLN: INTRAVENOUS | Status: DC

## 2016-11-04 MED ORDER — TRAMADOL HCL 50 MG PO TABS
ORAL_TABLET | ORAL | Status: AC
  Filled 2016-11-04: qty 1

## 2016-11-04 MED ORDER — ONDANSETRON HCL 4 MG PO TABS: 4.0000 mg | ORAL_TABLET | Freq: Four times a day (QID) | ORAL | Status: DC | PRN

## 2016-11-04 MED ORDER — ONDANSETRON HCL 4 MG/2ML IJ SOLN
INTRAMUSCULAR | Status: DC | PRN
  Administered 2016-11-04: 4 mg via INTRAVENOUS

## 2016-11-04 MED ORDER — PROPOFOL 10 MG/ML IV BOLUS
INTRAVENOUS | Status: DC | PRN
  Administered 2016-11-04: 200 mg via INTRAVENOUS

## 2016-11-04 MED ORDER — ONDANSETRON HCL 4 MG/2ML IJ SOLN: 4.0000 mg | Freq: Once | INTRAMUSCULAR | Status: DC | PRN

## 2016-11-04 MED ORDER — NEOMYCIN-POLYMYXIN B GU 40-200000 IR SOLN
Status: DC | PRN
  Administered 2016-11-04: 2 mL

## 2016-11-04 MED ORDER — SCOPOLAMINE 1 MG/3DAYS TD PT72
1.0000 | MEDICATED_PATCH | Freq: Once | TRANSDERMAL | Status: DC
  Administered 2016-11-04: 1.5 mg via TRANSDERMAL

## 2016-11-04 MED ORDER — CHLORHEXIDINE GLUCONATE 4 % EX LIQD: 60.0000 mL | Freq: Once | CUTANEOUS | Status: DC

## 2016-11-04 MED ORDER — MEPERIDINE HCL 25 MG/ML IJ SOLN
INTRAMUSCULAR | Status: AC
  Administered 2016-11-04: 25 mg
  Filled 2016-11-04: qty 1

## 2016-11-04 MED ORDER — DEXAMETHASONE SODIUM PHOSPHATE 10 MG/ML IJ SOLN
INTRAMUSCULAR | Status: DC | PRN
  Administered 2016-11-04: 4 mg via INTRAVENOUS

## 2016-11-04 MED ORDER — TRAMADOL HCL 50 MG PO TABS: 50.0000 mg | ORAL_TABLET | Freq: Four times a day (QID) | ORAL | 0 refills | Status: DC | PRN

## 2016-11-04 MED ORDER — ONDANSETRON HCL 4 MG/2ML IJ SOLN: 4.0000 mg | Freq: Four times a day (QID) | INTRAMUSCULAR | Status: DC | PRN

## 2016-11-04 MED ORDER — BUPIVACAINE HCL (PF) 0.25 % IJ SOLN
INTRAMUSCULAR | Status: AC
  Filled 2016-11-04: qty 30

## 2016-11-04 MED ORDER — SODIUM CHLORIDE 0.9 % IV SOLN: 25.0000 mg | Freq: Once | INTRAVENOUS | Status: DC

## 2016-11-04 MED ORDER — LIDOCAINE HCL (CARDIAC) 20 MG/ML IV SOLN
INTRAVENOUS | Status: DC | PRN
  Administered 2016-11-04: 80 mg via INTRAVENOUS

## 2016-11-04 MED ORDER — TRAMADOL HCL 50 MG PO TABS
50.0000 mg | ORAL_TABLET | Freq: Once | ORAL | Status: AC
  Administered 2016-11-04: 50 mg via ORAL

## 2016-11-04 MED ORDER — ACETAMINOPHEN 10 MG/ML IV SOLN
INTRAVENOUS | Status: AC
  Filled 2016-11-04: qty 100

## 2016-11-04 MED ORDER — GLYCOPYRROLATE 0.2 MG/ML IJ SOLN
INTRAMUSCULAR | Status: DC | PRN
  Administered 2016-11-04: 0.2 mg via INTRAVENOUS

## 2016-11-04 MED ORDER — PHENYLEPHRINE HCL 10 MG/ML IJ SOLN
INTRAMUSCULAR | Status: DC | PRN
  Administered 2016-11-04: 100 ug via INTRAVENOUS

## 2016-11-04 MED ORDER — FENTANYL CITRATE (PF) 100 MCG/2ML IJ SOLN
25.0000 ug | INTRAMUSCULAR | Status: DC | PRN
  Administered 2016-11-04 (×2): 25 ug via INTRAVENOUS

## 2016-11-04 MED ORDER — LACTATED RINGERS IV SOLN
INTRAVENOUS | Status: DC
  Administered 2016-11-04: 15:00:00 via INTRAVENOUS

## 2016-11-04 MED ORDER — METOCLOPRAMIDE HCL 10 MG PO TABS: 5.0000 mg | ORAL_TABLET | Freq: Three times a day (TID) | ORAL | Status: DC | PRN

## 2016-11-04 MED ORDER — SCOPOLAMINE 1 MG/3DAYS TD PT72
MEDICATED_PATCH | TRANSDERMAL | Status: AC
  Filled 2016-11-04: qty 1

## 2016-11-04 MED ORDER — FENTANYL CITRATE (PF) 100 MCG/2ML IJ SOLN
INTRAMUSCULAR | Status: DC | PRN
  Administered 2016-11-04 (×4): 25 ug via INTRAVENOUS

## 2016-11-04 MED ORDER — METOCLOPRAMIDE HCL 5 MG/ML IJ SOLN: 5.0000 mg | Freq: Three times a day (TID) | INTRAMUSCULAR | Status: DC | PRN

## 2016-11-04 SURGICAL SUPPLY — 26 items
BANDAGE ELASTIC 3 LF NS (GAUZE/BANDAGES/DRESSINGS) ×2 IMPLANT
BLADE SURG 15 STRL LF DISP TIS (BLADE) ×1 IMPLANT
BLADE SURG 15 STRL SS (BLADE) ×1
BNDG ESMARK 4X12 TAN STRL LF (GAUZE/BANDAGES/DRESSINGS) ×2 IMPLANT
CANISTER SUCT 1200ML W/VALVE (MISCELLANEOUS) ×2 IMPLANT
CAST PADDING 3X4FT ST 30246 (SOFTGOODS) ×1
CUFF TOURN 18 STER (MISCELLANEOUS) ×2 IMPLANT
DRSG DERMACEA 8X12 NADH (GAUZE/BANDAGES/DRESSINGS) ×2 IMPLANT
DURAPREP 26ML APPLICATOR (WOUND CARE) ×2 IMPLANT
ELECT CAUTERY BLADE 6.4 (BLADE) ×2 IMPLANT
ELECT REM PT RETURN 9FT ADLT (ELECTROSURGICAL) ×2
ELECTRODE REM PT RTRN 9FT ADLT (ELECTROSURGICAL) ×1 IMPLANT
GAUZE SPONGE 4X4 12PLY STRL (GAUZE/BANDAGES/DRESSINGS) ×2 IMPLANT
GLOVE BIOGEL M STRL SZ7.5 (GLOVE) ×2 IMPLANT
GLOVE INDICATOR 8.0 STRL GRN (GLOVE) ×2 IMPLANT
GOWN STRL REUS W/ TWL LRG LVL3 (GOWN DISPOSABLE) ×2 IMPLANT
GOWN STRL REUS W/TWL LRG LVL3 (GOWN DISPOSABLE) ×2
KIT RM TURNOVER STRD PROC AR (KITS) ×2 IMPLANT
NS IRRIG 500ML POUR BTL (IV SOLUTION) ×2 IMPLANT
PACK EXTREMITY ARMC (MISCELLANEOUS) ×2 IMPLANT
PAD CAST CTTN 3X4 STRL (SOFTGOODS) ×1 IMPLANT
SOL PREP PVP 2OZ (MISCELLANEOUS) ×2
SOLUTION PREP PVP 2OZ (MISCELLANEOUS) ×1 IMPLANT
SPLINT CAST 1 STEP 3X12 (MISCELLANEOUS) ×2 IMPLANT
STOCKINETTE STRL 4IN 9604848 (GAUZE/BANDAGES/DRESSINGS) ×2 IMPLANT
SUT ETHILON 5-0 FS-2 18 BLK (SUTURE) ×2 IMPLANT

## 2016-11-04 NOTE — Op Note (Signed)
OPERATIVE NOTE  DATE OF SURGERY:  11/04/2016  PATIENT NAME:  Veronica [name]   DOB: January 10, 1971  MRN: [id number]  PRE-OPERATIVE DIAGNOSIS: Left carpal tunnel syndrome  POST-OPERATIVE DIAGNOSIS:  Same  PROCEDURE:  Left carpal tunnel release  SURGEON:  Jena GaussJames P Vashawn Ekstein, Jr. M.D.  ANESTHESIA: general  ESTIMATED BLOOD LOSS: minimal  FLUIDS REPLACED: 500 mL of crystalloid  TOURNIQUET TIME: 24 minutes  DRAINS: None  INDICATIONS FOR SURGERY: Veronica [name] is a 45 y.o. year old female with a long history of numbness and paresthesias to the left hand. EMG/nerve conduction studies demonstrated findings consistent with carpal tunnel syndrome.The patient had not seen any significant improvement despite conservative nonsurgical intervention. After discussion of the risks and benefits of surgical intervention, the patient expressed understanding of the risks benefits and agree with plans for carpal tunnel release.   PROCEDURE IN DETAIL: The patient was brought into the operating room and after adequate general anesthesia, a tourniquet was placed on the patient's left upper arm.The left hand and arm were prepped with alcohol and Duraprep and draped in the usual sterile fashion. A "time-out" was performed as per usual protocol. The hand and forearm were exsanguinated using an Esmarch and the tourniquet was inflated to 250 mmHg. Loupe magnification was used throughout the procedure. An incision was made just ulnar to the thenar palmar crease. Dissection was carried down through the palmar fascia to the transverse carpal ligament. The transverse carpal ligament was sharply incised, taking care to protect the underlying structures with the carpal tunnel. Complete release of the transverse carpal ligament was achieved. There was no evidence of ganglion cyst or lipoma within the carpal tunnel. The wound was irrigated with copious amounts of normal saline with antibiotic solution. The skin was then re-approximated  with interrupted sutures of #5-0 nylon. A sterile dressing was applied followed by application of a volar splint. The tourniquet was deflated with a total tourniquet time of 24 minutes.  The patient tolerated the procedure well and was transported to the PACU in stable condition.  Shauntee Karp P. Angie FavaHooten, Jr., M.D.

## 2016-11-04 NOTE — Anesthesia Procedure Notes (Signed)
Procedure Name: LMA Insertion Date/Time: 11/04/2016 3:27 PM Performed by: Karoline CaldwellSTARR, Reeya Bound Pre-anesthesia Checklist: Patient identified, Emergency Drugs available, Suction available and Patient being monitored Patient Re-evaluated:Patient Re-evaluated prior to inductionOxygen Delivery Method: Circle system utilized Preoxygenation: Pre-oxygenation with 100% oxygen Intubation Type: IV induction Ventilation: Mask ventilation without difficulty LMA: LMA inserted LMA Size: 3.5 Number of attempts: 1 Placement Confirmation: positive ETCO2 and breath sounds checked- equal and bilateral Tube secured with: Tape Dental Injury: Teeth and Oropharynx as per pre-operative assessment

## 2016-11-04 NOTE — Anesthesia Preprocedure Evaluation (Signed)
Anesthesia Evaluation  Patient identified by MRN, date of birth, ID band Patient awake    Reviewed: Allergy & Precautions, NPO status , Patient's Chart, lab work & pertinent test results, reviewed documented beta blocker date and time   History of Anesthesia Complications (+) PONV and history of anesthetic complications  Airway Mallampati: II  TM Distance: >3 FB     Dental  (+) Chipped   Pulmonary shortness of breath,           Cardiovascular      Neuro/Psych  Headaches,    GI/Hepatic GERD  Controlled,  Endo/Other    Renal/GU      Musculoskeletal  (+) Arthritis ,   Abdominal   Peds  Hematology   Anesthesia Other Findings   Reproductive/Obstetrics                             Anesthesia Physical Anesthesia Plan  ASA: III  Anesthesia Plan: General   Post-op Pain Management:    Induction: Intravenous  Airway Management Planned: LMA  Additional Equipment:   Intra-op Plan:   Post-operative Plan:   Informed Consent: I have reviewed the patients History and Physical, chart, labs and discussed the procedure including the risks, benefits and alternatives for the proposed anesthesia with the patient or authorized representative who has indicated his/her understanding and acceptance.     Plan Discussed with: CRNA  Anesthesia Plan Comments:         Anesthesia Quick Evaluation

## 2016-11-04 NOTE — Discharge Instructions (Signed)
°  Instructions after Hand / Wrist Surgery ° ° James P. Hooten, Jr., M.D. ° Dept. of Orthopaedics & Sports Medicine ° [facility] ° [address] ° [place], [place]  [address] ° ° Phone: [phone]   Fax: [phone] ° ° °DIET: °• Drink plenty of non-alcoholic fluids & begin a light diet. °• Resume your normal diet the day after surgery. ° °ACTIVITY:  °• Keep the hand elevated above the level of the elbow. °• Begin gently moving the fingers on a regular basis to avoid stiffness. °• Avoid any heavy lifting, pushing, or pulling with the operative hand. °• Do not drive or operate any equipment until instructed. ° °WOUND CARE:  °• Keep the splint/bandage clean and dry.  °• The splint and stitches will be removed in the office. °• Continue to use the ice packs periodically to reduce pain and swelling. °• You may bathe or shower after the stitches are removed at the first office visit following surgery. ° °MEDICATIONS: °• You may resume your regular medications. °• Please take the pain medication as prescribed. °• Do not take pain medication on an empty stomach. °• Do not drive or drink alcoholic beverages when taking pain medications. ° °CALL THE OFFICE FOR: °• Temperature above 101 degrees °• Excessive bleeding or drainage on the dressing. °• Excessive swelling, coldness, or paleness of the fingers. °• Persistent nausea and vomiting. ° °FOLLOW-UP:  °• You should have an appointment to return to the office in 7-10 days after surgery.  ° °REMEMBER: R.I.C.E. = Rest, Ice, Compression, Elevation !  ° ° ° °AMBULATORY SURGERY  °DISCHARGE INSTRUCTIONS ° ° °1) The drugs that you were given will stay in your system until tomorrow so for the next 24 hours you should not: ° °A) Drive an automobile °B) Make any legal decisions °C) Drink any alcoholic beverage ° ° °2) You may resume regular meals tomorrow.  Today it is better to start with liquids and gradually work up to solid foods. ° °You may eat anything you prefer, but  it is better to start with liquids, then soup and crackers, and gradually work up to solid foods. ° ° °3) Please notify your doctor immediately if you have any unusual bleeding, trouble breathing, redness and pain at the surgery site, drainage, fever, or pain not relieved by medication. ° ° ° °4) Additional Instructions: ° ° ° ° ° ° ° °Please contact your physician with any problems or Same Day Surgery at [phone], Monday through Friday 6 am to 4 pm, or [place] [facility] [place] Main number at [phone]. °

## 2016-11-04 NOTE — Transfer of Care (Signed)
Immediate Anesthesia Transfer of Care Note  Patient: Veronica [name]  Procedure(s) Performed: Procedure(s): CARPAL TUNNEL RELEASE (Left)  Patient Location: PACU  Anesthesia Type:General  Level of Consciousness: awake, oriented and patient cooperative  Airway & Oxygen Therapy: Patient Spontanous Breathing and Patient connected to face mask oxygen  Post-op Assessment: Report given to RN, Post -op Vital signs reviewed and stable and Patient moving all extremities X 4  Post vital signs: Reviewed and stable  Last Vitals:  Vitals:   11/04/16 1456  BP: 112/69  Pulse: 66  Resp: 16  Temp: 37.2 C    Last Pain:  Vitals:   11/04/16 1456  TempSrc: Tympanic  PainSc: 4          Complications: No apparent anesthesia complications

## 2016-11-04 NOTE — H&P (Signed)
The patient has been re-examined, and the chart reviewed, and there have been no interval changes to the documented history and physical.    The risks, benefits, and alternatives have been discussed at length. The patient expressed understanding of the risks benefits and agreed with plans for surgical intervention.  James P. Hooten, Jr. M.D.    

## 2016-11-04 NOTE — Brief Op Note (Signed)
11/04/2016  4:21 PM  PATIENT:  Veronica [name]  45 y.o. female  PRE-OPERATIVE DIAGNOSIS:  LEFT WRIST CARPAL TUNNEL SYNDROME  POST-OPERATIVE DIAGNOSIS:  LEFT WRIST CARPAL TUNNEL SYNDROME  PROCEDURE:  Procedure(s): CARPAL TUNNEL RELEASE (Left)  SURGEON:  Surgeon(s) and Role:    * Donato HeinzJames P Fujiko Picazo, MD - Primary  ASSISTANTS: none   ANESTHESIA:   general  EBL:  Total I/O In: 500 [I.V.:500] Out: 5 [Blood:5]  BLOOD ADMINISTERED:none  DRAINS: none   LOCAL MEDICATIONS USED:  MARCAINE     SPECIMEN:  No Specimen  DISPOSITION OF SPECIMEN:  N/A  COUNTS:  YES  TOURNIQUET:   Total Tourniquet Time Documented: Upper Arm (Left) - 23 minutes Total: Upper Arm (Left) - 23 minutes   DICTATION: .Reubin Milanragon Dictation  PLAN OF CARE: Discharge to home after PACU  PATIENT DISPOSITION:  PACU - hemodynamically stable.   Delay start of Pharmacological VTE agent (>24hrs) due to surgical blood loss or risk of bleeding: not applicable

## 2016-11-05 ENCOUNTER — Encounter: Payer: Self-pay | Admitting: Orthopedic Surgery

## 2016-11-06 NOTE — Anesthesia Postprocedure Evaluation (Signed)
Anesthesia Post Note  Patient: Veronica [name]  Procedure(s) Performed: Procedure(s) (LRB): CARPAL TUNNEL RELEASE (Left)  Patient location during evaluation: PACU Anesthesia Type: General Level of consciousness: awake and alert Pain management: pain level controlled Vital Signs Assessment: post-procedure vital signs reviewed and stable Respiratory status: spontaneous breathing, nonlabored ventilation, respiratory function stable and patient connected to nasal cannula oxygen Cardiovascular status: blood pressure returned to baseline and stable Postop Assessment: no signs of nausea or vomiting Anesthetic complications: no    Last Vitals:  Vitals:   11/04/16 1703 11/04/16 1713  BP: 114/81 116/76  Pulse: 69 73  Resp: 16   Temp: 36.7 C     Last Pain:  Vitals:   11/05/16 0854  TempSrc:   PainSc: 5                  Marelin Tat S

## 2017-08-19 ENCOUNTER — Ambulatory Visit (INDEPENDENT_AMBULATORY_CARE_PROVIDER_SITE_OTHER): Payer: BLUE CROSS/BLUE SHIELD | Admitting: Obstetrics and Gynecology

## 2017-08-19 ENCOUNTER — Encounter: Payer: Self-pay | Admitting: Obstetrics and Gynecology

## 2017-08-19 VITALS — BP 100/60 | HR 60 | Ht 66.0 in | Wt 165.0 lb

## 2017-08-19 DIAGNOSIS — R195 Other fecal abnormalities: Secondary | ICD-10-CM

## 2017-08-19 DIAGNOSIS — N760 Acute vaginitis: Secondary | ICD-10-CM

## 2017-08-19 LAB — POCT WET PREP WITH KOH
Clue Cells Wet Prep HPF POC: NEGATIVE
KOH Prep POC: NEGATIVE
Trichomonas, UA: NEGATIVE
Yeast Wet Prep HPF POC: NEGATIVE

## 2017-08-19 MED ORDER — TERCONAZOLE 0.8 % VA CREA: 1.0000 | TOPICAL_CREAM | Freq: Every day | VAGINAL | 0 refills | Status: AC

## 2017-08-19 NOTE — Progress Notes (Signed)
Chief Complaint  Patient presents with  . Vaginitis    HPI:      [name] is a 46 y.o. No obstetric history on file. who LMP was Patient's last menstrual period was 02/20/2014., presents today for vaginal sx for the past 3 months. Pt started keto diet and thinks it's related. She complains of vaginal d/c, itching and burning, and mild cramping. She denies any odor now but had a smell a few days ago. She was on abx a few months ago. No meds to treat current sx. No urin sx, no LBP. She has had loose stools for awhile but is drinking diet sodas for the keto diet, new for pt.  She is s/p TAH.    Past Medical History:  Diagnosis Date  . Arm pain, right    from pinched nerve in neck  . Arthritis    neck  . Dyspnea    WITH EXERTION-STRESS TEST DONE 05-2016 BY DR PARASCHOS-NORMAL-  . GERD (gastroesophageal reflux disease)    H/O  . Headache    from pinched nerve in neck  . Pain    OCCC TWING,SHARPE PAIN IN CHEST  . Pinched nerve in neck    limited right head turn  . PONV (postoperative nausea and vomiting)     Past Surgical History:  Procedure Laterality Date  . ABDOMINAL HYSTERECTOMY  10/23/15  . CARPAL TUNNEL RELEASE Right 07/03/2016   Procedure: CARPAL TUNNEL RELEASE;  Surgeon: Donato Heinz, MD;  Location: [facility];  Service: Orthopedics;  Laterality: Right;  . CARPAL TUNNEL RELEASE Left 11/04/2016   Procedure: CARPAL TUNNEL RELEASE;  Surgeon: Donato Heinz, MD;  Location: [facility];  Service: Orthopedics;  Laterality: Left;  . COLLATERAL LIGAMENT REPAIR, ELBOW    . COLONOSCOPY N/A 01/02/2016   Procedure: COLONOSCOPY;  Surgeon: Wallace Cullens, MD;  Location: [facility];  Service: Gastroenterology;  Laterality: N/A;  . DILATION AND CURETTAGE OF UTERUS    . DILITATION & CURRETTAGE/HYSTROSCOPY WITH VERSAPOINT RESECTION N/A 03/18/2014   Procedure: DILATATION & CURETTAGE/HYSTEROSCOPY WITH VERSAPOINT RESECTION;  Surgeon: Lenoard Aden, MD;  Location: [facility];  Service:  Gynecology;  Laterality: N/A;  90 min. total  . ESOPHAGOGASTRODUODENOSCOPY N/A 01/02/2016   Procedure: ESOPHAGOGASTRODUODENOSCOPY (EGD);  Surgeon: Wallace Cullens, MD;  Location: [facility];  Service: Gastroenterology;  Laterality: N/A;  . NOVASURE ABLATION N/A 03/18/2014   Procedure: NOVASURE ABLATION;  Surgeon: Lenoard Aden, MD;  Location: [facility];  Service: Gynecology;  Laterality: N/A;  . RECTOCELE REPAIR N/A 03/18/2014   Procedure: POSTERIOR REPAIR (RECTOCELE)/Perineorrhaphy;  Surgeon: Lenoard Aden, MD;  Location: [facility];  Service: Gynecology;  Laterality: N/A;  . SPINAL FUSION    . TONSILLECTOMY    . TUBAL LIGATION      Family History  Problem Relation Age of Onset  . Skin cancer Father   . Cancer Brother   . Breast cancer Maternal Aunt     Social History   Social History  . Marital status: Married    Spouse name: N/A  . Number of children: N/A  . Years of education: N/A   Occupational History  . Not on file.   Social History Main Topics  . Smoking status: Never Smoker  . Smokeless tobacco: Never Used  . Alcohol use No  . Drug use: No  . Sexual activity: Yes   Other Topics Concern  . Not on file   Social History Narrative  . No narrative  on file     Current Outpatient Prescriptions:  .  amphetamine-dextroamphetamine (ADDERALL) 20 MG tablet, Take 10 mg by mouth daily. , Disp: , Rfl: 0 .  cetirizine (ZYRTEC) 10 MG tablet, Take 10 mg by mouth daily., Disp: , Rfl:  .  esomeprazole (NEXIUM) 40 MG capsule, Take 40 mg by mouth daily as needed (heartburn). , Disp: , Rfl:  .  ibuprofen (ADVIL,MOTRIN) 200 MG tablet, Take 400 mg by mouth every 6 (six) hours as needed for headache or moderate pain., Disp: , Rfl:  .  magnesium oxide (MAG-OX) 400 MG tablet, Take 400 mg by mouth every morning. , Disp: , Rfl:  .  Melatonin 5 MG TABS, Take 5 mg by mouth at bedtime., Disp: , Rfl:  .  terconazole (TERAZOL 3) 0.8 % vaginal cream, Place 1 applicator vaginally at bedtime.,  Disp: 20 g, Rfl: 0 .  traMADol (ULTRAM) 50 MG tablet, Take 1-2 tablets (50-100 mg total) by mouth every 6 (six) hours as needed for moderate pain., Disp: 30 tablet, Rfl: 0   ROS:  Review of Systems  Constitutional: Negative for fever.  Gastrointestinal: Positive for diarrhea. Negative for blood in stool, constipation, nausea and vomiting.  Genitourinary: Positive for dysuria and vaginal discharge. Negative for dyspareunia, flank pain, frequency, hematuria, urgency, vaginal bleeding and vaginal pain.  Musculoskeletal: Negative for back pain.  Skin: Negative for rash.     OBJECTIVE:   Vitals:  BP 100/60   Pulse 60   Ht 5\' 6"  (1.676 m)   Wt 165 lb (74.8 kg)   LMP 02/20/2014   BMI 26.63 kg/m   Physical Exam  Constitutional: She is oriented to person, place, and time and well-developed, well-nourished, and in no distress. Vital signs are normal.  Genitourinary: Uterus normal, cervix normal, right adnexa normal, left adnexa normal and vulva normal. Uterus is not enlarged. Cervix exhibits no motion tenderness and no tenderness. Right adnexum displays no mass and no tenderness. Left adnexum displays no mass and no tenderness. Vulva exhibits no erythema, no exudate, no lesion, no rash and no tenderness. Vagina exhibits no lesion. Thick  odorless  white and vaginal discharge found.  Neurological: She is oriented to person, place, and time.  Vitals reviewed.   Results: Results for orders placed or performed in visit on 08/19/17 (from the past 24 hour(s))  POCT Wet Prep with KOH     Status: Normal   Collection Time: 08/19/17 11:57 AM  Result Value Ref Range   Trichomonas, UA Negative    Clue Cells Wet Prep HPF POC neg    Epithelial Wet Prep HPF POC  Few, Moderate, Many, Too numerous to count   Yeast Wet Prep HPF POC neg    Bacteria Wet Prep HPF POC  Few   RBC Wet Prep HPF POC     WBC Wet Prep HPF POC     KOH Prep POC Negative Negative     Assessment/Plan: Acute vaginitis - Neg  wet prep/neg exam. Question yeast given sx and recent abx use. Rx terazol. F/u prn sx. If sx persist, can do culture.  - Plan: terconazole (TERAZOL 3) 0.8 % vaginal cream, POCT Wet Prep with KOH  Loose stools - D/C diet drinks to see if culprit.     Meds ordered this encounter  Medications  . terconazole (TERAZOL 3) 0.8 % vaginal cream    Sig: Place 1 applicator vaginally at bedtime.    Dispense:  20 g    Refill:  0  Return if symptoms worsen or fail to improve.  Lorie Cleckley B. Areebah Meinders, PA-C 08/19/2017 11:58 AM

## 2017-09-15 ENCOUNTER — Ambulatory Visit (INDEPENDENT_AMBULATORY_CARE_PROVIDER_SITE_OTHER): Payer: BLUE CROSS/BLUE SHIELD | Admitting: Obstetrics and Gynecology

## 2017-09-15 ENCOUNTER — Encounter: Payer: Self-pay | Admitting: Obstetrics and Gynecology

## 2017-09-15 VITALS — BP 98/66 | HR 65 | Ht 66.0 in | Wt 165.0 lb

## 2017-09-15 DIAGNOSIS — Z113 Encounter for screening for infections with a predominantly sexual mode of transmission: Secondary | ICD-10-CM | POA: Diagnosis not present

## 2017-09-15 DIAGNOSIS — R3915 Urgency of urination: Secondary | ICD-10-CM | POA: Diagnosis not present

## 2017-09-15 DIAGNOSIS — N898 Other specified noninflammatory disorders of vagina: Secondary | ICD-10-CM

## 2017-09-15 DIAGNOSIS — R102 Pelvic and perineal pain: Secondary | ICD-10-CM

## 2017-09-15 LAB — POCT URINALYSIS DIPSTICK
Bilirubin, UA: NEGATIVE
GLUCOSE UA: NEGATIVE
Ketones, UA: NEGATIVE
Leukocytes, UA: NEGATIVE
NITRITE UA: NEGATIVE
PH UA: 5.5 (ref 5.0–8.0)
Protein, UA: NEGATIVE
RBC UA: NEGATIVE
Spec Grav, UA: 1.01 (ref 1.010–1.025)
UROBILINOGEN UA: NEGATIVE U/dL — AB

## 2017-09-15 MED ORDER — FLUCONAZOLE 150 MG PO TABS: 150.0000 mg | ORAL_TABLET | Freq: Once | ORAL | 0 refills | Status: AC

## 2017-09-15 MED ORDER — NITROFURANTOIN MONOHYD MACRO 100 MG PO CAPS: 100.0000 mg | ORAL_CAPSULE | Freq: Two times a day (BID) | ORAL | 0 refills | Status: AC

## 2017-09-15 NOTE — Progress Notes (Signed)
Chief Complaint  Patient presents with  . Urinary Tract Infection    Urgency/dull sharpe pain in low pelvic x4 days    HPI:      Veronica [name] is a 46 y.o. [id number] who LMP was Patient's last menstrual period was 02/20/2014., presents today for urinary frequency/urgency without dysuria, as well as suprapubic pain/pressure for the past wk. She had leftover clindamycin 300 mg that she has been taking BID for 3 days without relief. She did an AZO test strip which was pos for WBCs. She tried AZO for frequency with some improvement. She drinks almost 3 cups of caffeine daily. She denies any vag d/c, irritation, but still has an odor (usually resolved with washing). Treated 9/18 with terazol for presumptive yeast, even though had neg wet prep/exam. No urin sx at that time. She is s/p TAH. She would like STD testing, even though no known exposures/new partners.    Past Medical History:  Diagnosis Date  . Arm pain, right    from pinched nerve in neck  . Arthritis    neck  . Dyspnea    WITH EXERTION-STRESS TEST DONE 05-2016 BY DR PARASCHOS-NORMAL-  . GERD (gastroesophageal reflux disease)    H/O  . Headache    from pinched nerve in neck  . Pain    OCCC TWING,SHARPE PAIN IN CHEST  . Pinched nerve in neck    limited right head turn  . PONV (postoperative nausea and vomiting)     Past Surgical History:  Procedure Laterality Date  . ABDOMINAL HYSTERECTOMY  10/23/15  . CARPAL TUNNEL RELEASE Right 07/03/2016   Procedure: CARPAL TUNNEL RELEASE;  Surgeon: Donato Heinz, MD;  Location: [facility];  Service: Orthopedics;  Laterality: Right;  . CARPAL TUNNEL RELEASE Left 11/04/2016   Procedure: CARPAL TUNNEL RELEASE;  Surgeon: Donato Heinz, MD;  Location: [facility];  Service: Orthopedics;  Laterality: Left;  . COLLATERAL LIGAMENT REPAIR, ELBOW    . COLONOSCOPY N/A 01/02/2016   Procedure: COLONOSCOPY;  Surgeon: Wallace Cullens, MD;  Location: [facility];  Service: Gastroenterology;   Laterality: N/A;  . DILATION AND CURETTAGE OF UTERUS    . DILITATION & CURRETTAGE/HYSTROSCOPY WITH VERSAPOINT RESECTION N/A 03/18/2014   Procedure: DILATATION & CURETTAGE/HYSTEROSCOPY WITH VERSAPOINT RESECTION;  Surgeon: Lenoard Aden, MD;  Location: [facility];  Service: Gynecology;  Laterality: N/A;  90 min. total  . ESOPHAGOGASTRODUODENOSCOPY N/A 01/02/2016   Procedure: ESOPHAGOGASTRODUODENOSCOPY (EGD);  Surgeon: Wallace Cullens, MD;  Location: [facility];  Service: Gastroenterology;  Laterality: N/A;  . NOVASURE ABLATION N/A 03/18/2014   Procedure: NOVASURE ABLATION;  Surgeon: Lenoard Aden, MD;  Location: [facility];  Service: Gynecology;  Laterality: N/A;  . RECTOCELE REPAIR N/A 03/18/2014   Procedure: POSTERIOR REPAIR (RECTOCELE)/Perineorrhaphy;  Surgeon: Lenoard Aden, MD;  Location: [facility];  Service: Gynecology;  Laterality: N/A;  . SPINAL FUSION    . TONSILLECTOMY    . TUBAL LIGATION      Family History  Problem Relation Age of Onset  . Skin cancer Father   . Cancer Brother   . Breast cancer Maternal Aunt     Social History   Social History  . Marital status: Married    Spouse name: N/A  . Number of children: N/A  . Years of education: N/A   Occupational History  . Not on file.   Social History Main Topics  . Smoking status: Never Smoker  . Smokeless tobacco: Never Used  .  Alcohol use No  . Drug use: No  . Sexual activity: Yes   Other Topics Concern  . Not on file   Social History Narrative  . No narrative on file     Current Outpatient Prescriptions:  .  cetirizine (ZYRTEC) 10 MG tablet, Take 10 mg by mouth daily., Disp: , Rfl:  .  esomeprazole (NEXIUM) 40 MG capsule, Take 40 mg by mouth daily as needed (heartburn). , Disp: , Rfl:  .  magnesium oxide (MAG-OX) 400 MG tablet, Take 400 mg by mouth every morning. , Disp: , Rfl:  .  Melatonin 5 MG TABS, Take 5 mg by mouth at bedtime., Disp: , Rfl:  .  Potassium 99 MG TABS, Take by mouth., Disp: , Rfl:  .   fluconazole (DIFLUCAN) 150 MG tablet, Take 1 tablet (150 mg total) by mouth once., Disp: 1 tablet, Rfl: 0 .  nitrofurantoin, macrocrystal-monohydrate, (MACROBID) 100 MG capsule, Take 1 capsule (100 mg total) by mouth 2 (two) times daily., Disp: 14 capsule, Rfl: 0   ROS:  Review of Systems  Constitutional: Negative for fever.  Gastrointestinal: Negative for blood in stool, constipation, diarrhea, nausea and vomiting.  Genitourinary: Positive for frequency, pelvic pain and urgency. Negative for dyspareunia, dysuria, flank pain, hematuria, vaginal bleeding, vaginal discharge and vaginal pain.  Musculoskeletal: Negative for back pain.  Skin: Negative for rash.     OBJECTIVE:   Vitals:  BP 98/66 (BP Location: Left Arm, Patient Position: Sitting, Cuff Size: Normal)   Pulse 65   Ht  (1.676 m)   Wt 165 lb (74.8 kg)   LMP 02/20/2014   BMI 26.63 kg/m   Physical Exam  Constitutional: She is oriented to person, place, and time and well-developed, well-nourished, and in no distress. Vital signs are normal.  Abdominal: There is no CVA tenderness.  Genitourinary: Vagina normal, right adnexa normal, left adnexa normal and vulva normal. Right adnexum displays no mass and no tenderness. Left adnexum displays no mass and no tenderness. Vulva exhibits no erythema, no exudate, no lesion, no rash and no tenderness. Vagina exhibits no lesion.  Genitourinary Comments: VAG CUFF TENDER ON EXAM; SUPRAPUBIC TENDERNESS  Neurological: She is alert and oriented to person, place, and time.  Psychiatric: Affect and judgment normal.  Vitals reviewed.   Results: Results for orders placed or performed in visit on 09/15/17 (from the past 24 hour(s))  POCT urinalysis dipstick     Status: Normal   Collection Time: 09/15/17  3:15 PM  Result Value Ref Range   Color, UA gold    Clarity, UA Clear    Glucose, UA Neg    Bilirubin, UA Neg    Ketones, UA Neg    Spec Grav, UA 1.010 1.010 - 1.025   Blood, UA Neg     pH, UA 5.5 5.0 - 8.0   Protein, UA Neg    Urobilinogen, UA negative (A) 0.2 or 1.0 E.U./dL   Nitrite, UA Neg    Leukocytes, UA Negative Negative     Assessment/Plan: Urgency of urination - Neg dip. Tender on exam. Rx macrobid given sx/hx. Check C&S. Will call pt with results/sx f/u. D/C caffeine. Rx diflucan due to abx use. - Plan: POCT urinalysis dipstick, Urine Culture, nitrofurantoin, macrocrystal-monohydrate, (MACROBID) 100 MG capsule, fluconazole (DIFLUCAN) 150 MG tablet  Pelvic pain - Plan: POCT urinalysis dipstick  Vaginal odor - Check one swab leukorrhea and AV panel. Will f/u with resulst.  - Plan: Other/Misc lab test  Screening for STD (  sexually transmitted disease) - Plan: Other/Misc lab test    Meds ordered this encounter  Medications  . Potassium 99 MG TABS    Sig: Take by mouth.  . nitrofurantoin, macrocrystal-monohydrate, (MACROBID) 100 MG capsule    Sig: Take 1 capsule (100 mg total) by mouth 2 (two) times daily.    Dispense:  14 capsule    Refill:  0  . fluconazole (DIFLUCAN) 150 MG tablet    Sig: Take 1 tablet (150 mg total) by mouth once.    Dispense:  1 tablet    Refill:  0      Return if symptoms worsen or fail to improve.  Aidric Endicott B. Fowler Antos, PA-C 09/15/2017 4:22 PM

## 2017-09-17 LAB — URINE CULTURE: ORGANISM ID, BACTERIA: NO GROWTH

## 2017-09-20 LAB — POCT WET PREP WITH KOH

## 2017-09-25 ENCOUNTER — Telehealth: Payer: Self-pay | Admitting: Obstetrics and Gynecology

## 2017-09-25 NOTE — Telephone Encounter (Signed)
Pt aware of neg AV and STD testing on One Swab. Sx resolved. Pt doing fine. F/u prn.

## 2018-02-20 ENCOUNTER — Ambulatory Visit (INDEPENDENT_AMBULATORY_CARE_PROVIDER_SITE_OTHER): Payer: BLUE CROSS/BLUE SHIELD

## 2018-02-20 ENCOUNTER — Ambulatory Visit
Admission: EM | Admit: 2018-02-20 | Discharge: 2018-02-20 | Disposition: A | Discharge status: Home / Self Care | Payer: BLUE CROSS/BLUE SHIELD | Attending: Family Medicine | Admitting: Family Medicine

## 2018-02-20 ENCOUNTER — Other Ambulatory Visit: Payer: Self-pay

## 2018-02-20 DIAGNOSIS — R0789 Other chest pain: Secondary | ICD-10-CM

## 2018-02-20 MED ORDER — NAPROXEN 500 MG PO TABS: 500.0000 mg | ORAL_TABLET | Freq: Two times a day (BID) | ORAL | 0 refills | Status: AC

## 2018-02-20 NOTE — ED Triage Notes (Signed)
Patient complains of chest tightness that started 1 week ago. Patient states that she recently had a URI. Patient reports that she has been noticing what feels like a rapid heart beat.

## 2018-02-20 NOTE — ED Provider Notes (Signed)
[facility]    CSN: [id number] Arrival date & time: 02/20/18  1191     History   Chief Complaint Chief Complaint  Patient presents with  . Chest Pain    HPI Veronica [name] is a 47 y.o. female.   HPI  The 47 year old female presents with complaints of chest tightness that started a week ago.  She states that she recently had a URI but did not involve any coughing.  She was sneezing quite a bit however.  She has been also noticing an occasional rapid heartbeat that would be very momentary.  No nausea or vomiting.  No significant shortness of breath.  She has not found any tenderness on her chest wall.  In review of her previous medical history, She has had shortness of breath on exertion sinus tachycardia abnormal EKG.  She is also undergone stress testing.  No chest pain on a Bruce protocol and no EKG changes.  Normal left ventricular function of 55%.  And no evidence of ischemia.  Was performed on 06/05/2016 also in her medical records shows that she has had intermittent episodes of pain typical and atypical features progressive exertional dyspnea and peripheral edema.        Past Medical History:  Diagnosis Date  . Arm pain, right    from pinched nerve in neck  . Arthritis    neck  . Dyspnea    WITH EXERTION-STRESS TEST DONE 05-2016 BY DR PARASCHOS-NORMAL-  . GERD (gastroesophageal reflux disease)    H/O  . Headache    from pinched nerve in neck  . Pain    [facility] TWING,SHARPE PAIN IN CHEST  . Pinched nerve in neck    limited right head turn  . PONV (postoperative nausea and vomiting)     There are no active problems to display for this patient.   Past Surgical History:  Procedure Laterality Date  . ABDOMINAL HYSTERECTOMY  10/23/15  . CARPAL TUNNEL RELEASE Right 07/03/2016   Procedure: CARPAL TUNNEL RELEASE;  Surgeon: Donato Heinz, MD;  Location: [facility];  Service: Orthopedics;  Laterality: Right;  . CARPAL TUNNEL RELEASE Left 11/04/2016   Procedure:  CARPAL TUNNEL RELEASE;  Surgeon: Donato Heinz, MD;  Location: [facility];  Service: Orthopedics;  Laterality: Left;  . COLLATERAL LIGAMENT REPAIR, ELBOW    . COLONOSCOPY N/A 01/02/2016   Procedure: COLONOSCOPY;  Surgeon: Wallace Cullens, MD;  Location: [facility];  Service: Gastroenterology;  Laterality: N/A;  . DILATION AND CURETTAGE OF UTERUS    . DILITATION & CURRETTAGE/HYSTROSCOPY WITH VERSAPOINT RESECTION N/A 03/18/2014   Procedure: DILATATION & CURETTAGE/HYSTEROSCOPY WITH VERSAPOINT RESECTION;  Surgeon: Lenoard Aden, MD;  Location: [facility];  Service: Gynecology;  Laterality: N/A;  90 min. total  . ESOPHAGOGASTRODUODENOSCOPY N/A 01/02/2016   Procedure: ESOPHAGOGASTRODUODENOSCOPY (EGD);  Surgeon: Wallace Cullens, MD;  Location: [facility];  Service: Gastroenterology;  Laterality: N/A;  . NOVASURE ABLATION N/A 03/18/2014   Procedure: NOVASURE ABLATION;  Surgeon: Lenoard Aden, MD;  Location: [facility];  Service: Gynecology;  Laterality: N/A;  . RECTOCELE REPAIR N/A 03/18/2014   Procedure: POSTERIOR REPAIR (RECTOCELE)/Perineorrhaphy;  Surgeon: Lenoard Aden, MD;  Location: [facility];  Service: Gynecology;  Laterality: N/A;  . SPINAL FUSION    . TONSILLECTOMY    . TUBAL LIGATION      OB History    Gravida Para Term Preterm AB Living   3 2 2   1 2    SAB TAB  Ectopic Multiple Live Births   1               Home Medications    Prior to Admission medications   Medication Sig Start Date End Date Taking? Authorizing Provider  amphetamine-dextroamphetamine (ADDERALL) 7.5 MG tablet Take 7.5 mg by mouth daily.   Yes [provider]  cetirizine (ZYRTEC) 10 MG tablet Take 10 mg by mouth daily.   Yes [provider]  Melatonin 5 MG TABS Take 5 mg by mouth at bedtime.   Yes [provider]  Potassium 99 MG TABS Take by mouth.   Yes [provider]  naproxen (NAPROSYN) 500 MG tablet Take 1 tablet (500 mg total) by mouth 2 (two) times daily with a meal.  02/20/18   Lutricia Feil, PA-C    Family History Family History  Problem Relation Age of Onset  . Skin cancer Father   . Cancer Brother   . Breast cancer Maternal Aunt     Social History Social History   Tobacco Use  . Smoking status: Never Smoker  . Smokeless tobacco: Never Used  Substance Use Topics  . Alcohol use: No  . Drug use: No     Allergies   Penicillins; Amoxicillin; Oxycodone; and Tylenol [acetaminophen]   Review of Systems Review of Systems  Constitutional: Positive for activity change. Negative for appetite change, chills, fatigue and fever.  Respiratory: Positive for chest tightness.   All other systems reviewed and are negative.    Physical Exam Triage Vital Signs ED Triage Vitals  Enc Vitals Group     BP 02/20/18 0927 123/77     Pulse Rate 02/20/18 0927 85     Resp 02/20/18 0927 17     Temp 02/20/18 0927 97.7 F (36.5 C)     Temp Source 02/20/18 0927 Oral     SpO2 02/20/18 0927 100 %     Weight 02/20/18 0926 157 lb (71.2 kg)     Height 02/20/18 0926 5\' 6"  (1.676 m)     Head Circumference --      Peak Flow --      Pain Score 02/20/18 0925 4     Pain Loc --      Pain Edu? --      Excl. in GC? --    No data found.  Updated Vital Signs BP 123/77 (BP Location: Left Arm)   Pulse 85   Temp 97.7 F (36.5 C) (Oral)   Resp 17   Ht 5\' 6"  (1.676 m)   Wt 157 lb (71.2 kg)   LMP 02/20/2014   SpO2 100%   BMI 25.34 kg/m   Visual Acuity Right Eye Distance:   Left Eye Distance:   Bilateral Distance:    Right Eye Near:   Left Eye Near:    Bilateral Near:     Physical Exam  Constitutional: She is oriented to person, place, and time. She appears well-developed and well-nourished.  Non-toxic appearance. She does not appear ill. No distress.  HENT:  Head: Normocephalic and atraumatic.  Eyes: Pupils are equal, round, and reactive to light.  Neck: Normal range of motion. Neck supple.  Cardiovascular: Normal rate and regular rhythm. Exam  reveals no S3 and no S4.  She has no chest wall tenderness to deep palpation.  Pulmonary/Chest: Effort normal and breath sounds normal.  Abdominal: Soft. Bowel sounds are normal.  Musculoskeletal: Normal range of motion.  Neurological: She is alert and oriented to person, place, and  time.  Skin: Skin is warm and dry.  Psychiatric: She has a normal mood and affect. Her behavior is normal.  Nursing note and vitals reviewed.    UC Treatments / Results  Labs (all labs ordered are listed, but only abnormal results are displayed) Labs Reviewed - No data to display  EKG  EKG Interpretation None       Radiology Dg Chest 2 View  Result Date: 02/20/2018 CLINICAL DATA:  Chest pain. EXAM: CHEST - 2 VIEW COMPARISON:  None. FINDINGS: The heart size and mediastinal contours are within normal limits. Both lungs are clear. No pneumothorax or pleural effusion is noted. The visualized skeletal structures are unremarkable. IMPRESSION: No active cardiopulmonary disease. Electronically Signed   By: Lupita RaiderJames  Green Jr, M.D.   On: 02/20/2018 10:51    Procedures Procedures (including critical care time)  Medications Ordered in UC Medications - No data to display   Initial Impression / Assessment and Plan / UC Course  I have reviewed the triage vital signs and the nursing notes.  Pertinent labs & imaging results that were available during my care of the patient were reviewed by me and considered in my medical decision making (see chart for details).     Plan: 1. Test/x-ray results and diagnosis reviewed with patient 2. rx as per orders; risks, benefits, potential side effects reviewed with patient 3. Recommend supportive treatment with rest as necessary.  May represent anxiety from the amount of stress that she has at work.  However it could be from her violent sneezing and chest wall pain.  At any rate I told her that I cannot find a source today for her pain.  Offered to send her to the emergency  room but she does not want to go.  I told her that she has any changes or if it worsens she should certainly go to the emergency room immediately or call 911.  Otherwise should follow-up with her primary care physician as soon as possible for further evaluation. 4. F/u prn if symptoms worsen or don't improve   Final Clinical Impressions(s) / UC Diagnoses   Final diagnoses:  Atypical chest pain    ED Discharge Orders        Ordered    naproxen (NAPROSYN) 500 MG tablet  2 times daily with meals     02/20/18 1108       Controlled Substance Prescriptions [place] Controlled Substance Registry consulted? Not Applicable  Lutricia FeilRoemer, Keigo Whalley P, PA-C 02/20/18 1118

## 2018-02-20 NOTE — Discharge Instructions (Signed)
If you have worsening symptoms go immediately to the emergency room or call 911.  Otherwise recommend following up with your primary care physician as soon as possible

## 2018-10-21 DIAGNOSIS — R002 Palpitations: Secondary | ICD-10-CM | POA: Insufficient documentation

## 2018-10-26 ENCOUNTER — Other Ambulatory Visit: Payer: Self-pay | Admitting: Orthopedic Surgery

## 2018-10-26 DIAGNOSIS — M25551 Pain in right hip: Secondary | ICD-10-CM

## 2018-11-08 ENCOUNTER — Ambulatory Visit
Admission: RE | Admit: 2018-11-08 | Discharge: 2018-11-08 | Disposition: A | Discharge status: Home / Self Care | Payer: BLUE CROSS/BLUE SHIELD | Source: Ambulatory Visit | Attending: Orthopedic Surgery | Admitting: Orthopedic Surgery

## 2018-11-08 DIAGNOSIS — M25551 Pain in right hip: Secondary | ICD-10-CM

## 2018-11-10 DIAGNOSIS — G8929 Other chronic pain: Secondary | ICD-10-CM | POA: Insufficient documentation

## 2018-11-19 ENCOUNTER — Telehealth: Payer: Self-pay

## 2018-11-19 DIAGNOSIS — E785 Hyperlipidemia, unspecified: Secondary | ICD-10-CM

## 2018-11-19 NOTE — Telephone Encounter (Addendum)
Dr Gwen PoundsKowalski wants CT CA score for this pt  Name Veronica [name] DOB 03-22-1971 DX Hyperlipidemia

## 2018-12-11 ENCOUNTER — Ambulatory Visit (INDEPENDENT_AMBULATORY_CARE_PROVIDER_SITE_OTHER)
Admission: RE | Admit: 2018-12-11 | Discharge: 2018-12-11 | Disposition: A | Discharge status: Home / Self Care | Payer: BLUE CROSS/BLUE SHIELD | Source: Ambulatory Visit | Attending: Cardiovascular Disease | Admitting: Cardiovascular Disease

## 2018-12-11 DIAGNOSIS — E785 Hyperlipidemia, unspecified: Secondary | ICD-10-CM

## 2019-01-27 DIAGNOSIS — R151 Fecal smearing: Secondary | ICD-10-CM | POA: Insufficient documentation

## 2019-04-14 ENCOUNTER — Telehealth: Payer: BLUE CROSS/BLUE SHIELD | Admitting: Nurse Practitioner

## 2019-04-14 DIAGNOSIS — M542 Cervicalgia: Secondary | ICD-10-CM

## 2019-04-14 DIAGNOSIS — M5441 Lumbago with sciatica, right side: Secondary | ICD-10-CM

## 2019-04-14 DIAGNOSIS — G8929 Other chronic pain: Secondary | ICD-10-CM

## 2019-04-14 MED ORDER — CYCLOBENZAPRINE HCL 10 MG PO TABS: 10.0000 mg | ORAL_TABLET | Freq: Three times a day (TID) | ORAL | 1 refills | Status: AC | PRN

## 2019-04-14 MED ORDER — PREDNISONE 10 MG (21) PO TBPK: ORAL_TABLET | ORAL | 0 refills | Status: AC

## 2019-04-14 NOTE — Progress Notes (Signed)
I reveiwed your previous evisit this morning and steroid dose pack was called into your pharmacy. We cannot do chronic longterm medications in an evist. You will need to see a rheumatologist for trexall.

## 2019-04-14 NOTE — Progress Notes (Signed)

## 2020-02-21 DIAGNOSIS — F419 Anxiety disorder, unspecified: Secondary | ICD-10-CM | POA: Insufficient documentation

## 2020-02-21 DIAGNOSIS — F909 Attention-deficit hyperactivity disorder, unspecified type: Secondary | ICD-10-CM | POA: Insufficient documentation

## 2020-07-05 IMAGING — MR MR HIP*R* W/O CM
5 series · 35 of 40 positions shown · non-contrast
Comparison: None.

CLINICAL DATA: Right hip pain radiating into the buttock and foot
for 2 years. No known injury.

EXAM:
MR OF THE RIGHT HIP WITHOUT CONTRAST
TECHNIQUE: Multiplanar, multisequence MR imaging was performed. No intravenous
contrast was administered.

[Series 9: T2 fat-sat · coronal · right · 3.0mm · 0.80mm/px · 8 of 30 slices shown (1 of 2)]
[im 1/30]
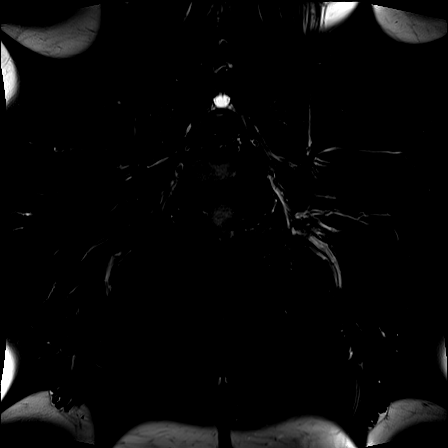
[im 5/30]
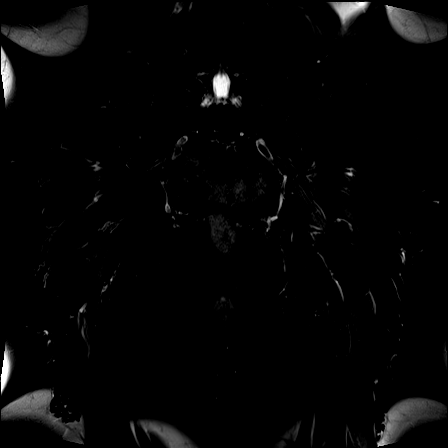
[im 9/30]
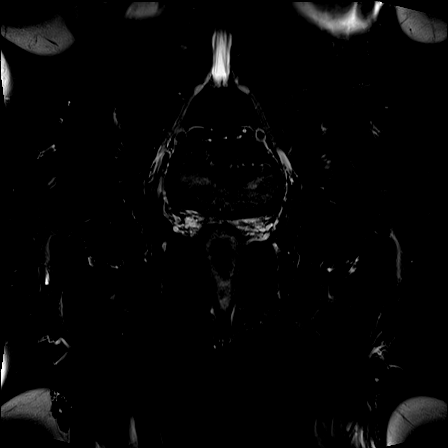
[im 13/30]
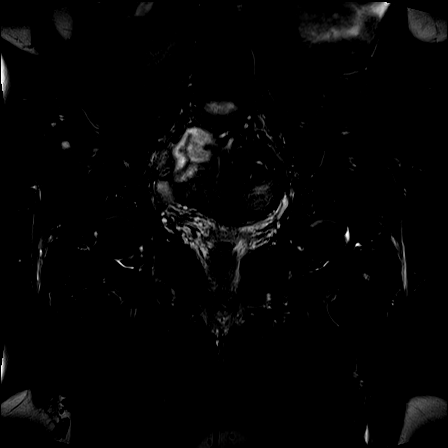
[im 17/30]
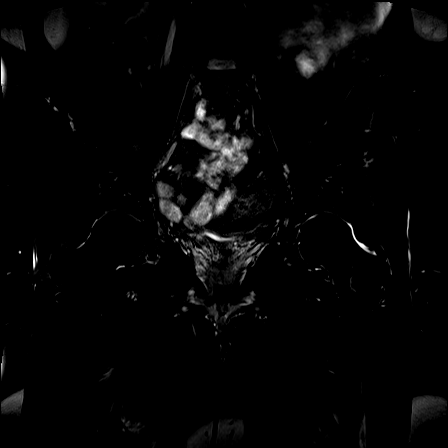
[im 21/30]
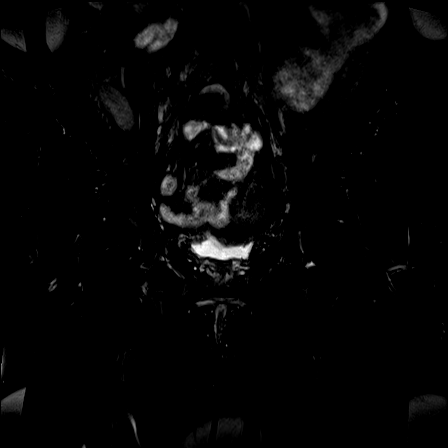
[im 25/30]
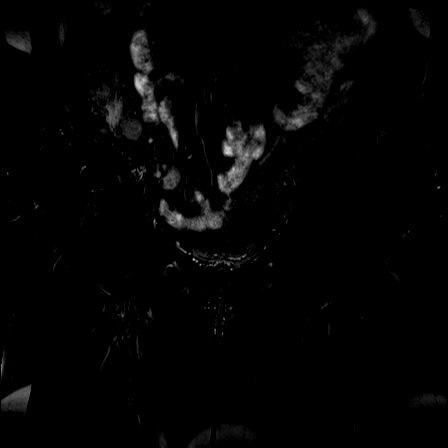
[im 30/30]
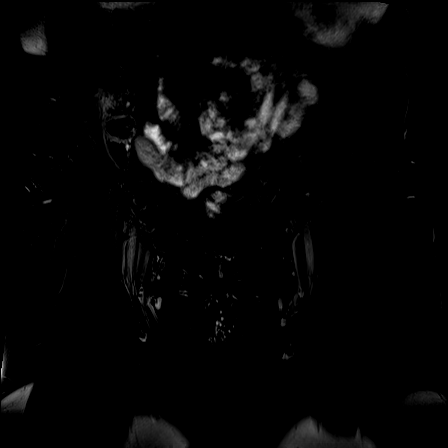

[Series 10: T1 · coronal · right · 3.0mm · 0.80mm/px · 3 of 30 slices shown]
[im 1/30]
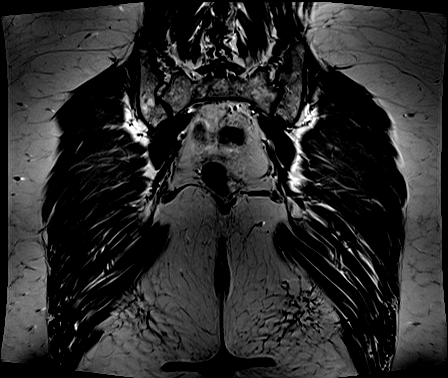
[im 5/30]
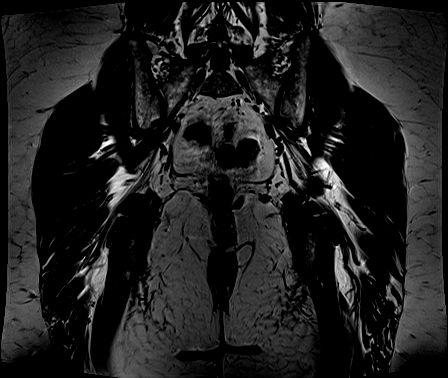
[im 9/30]
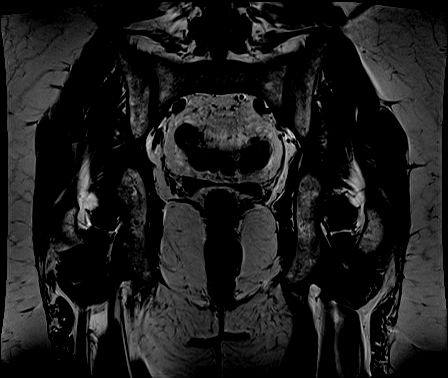

[Series 15: T2 fat-sat · axial · right · 3.0mm · 0.56mm/px · z∈[-49,+56]mm · 8 of 30 slices shown (2 of 2)]
[im 1/30]
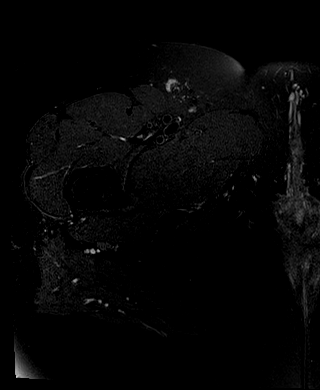
[im 5/30]
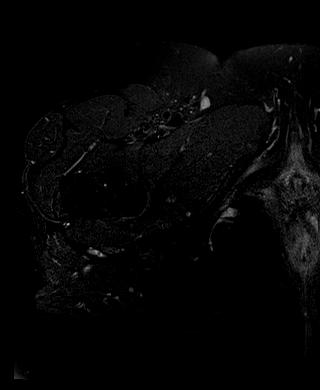
[im 9/30]
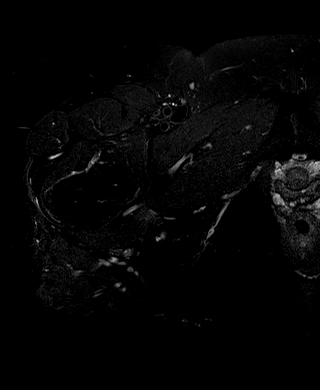
[im 13/30]
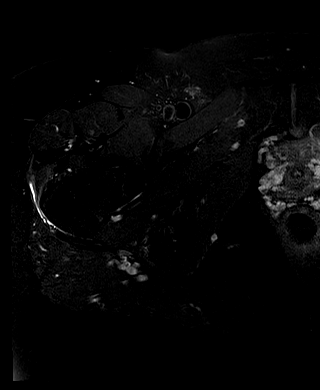
[im 17/30]
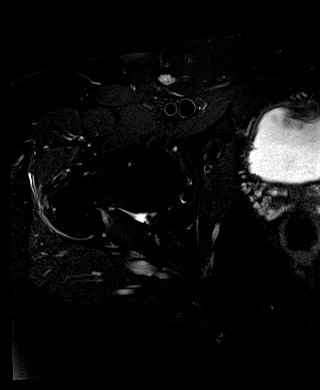
[im 21/30]
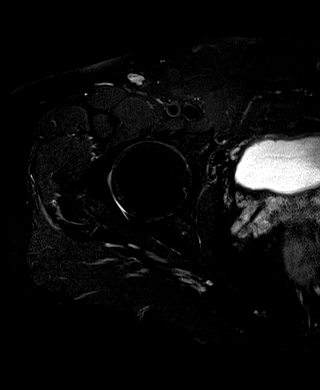
[im 25/30]
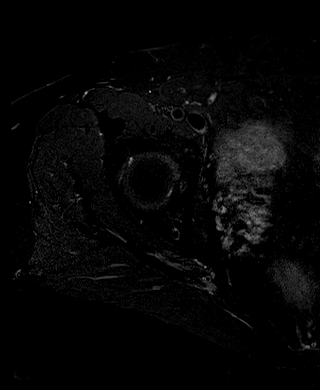
[im 30/30]
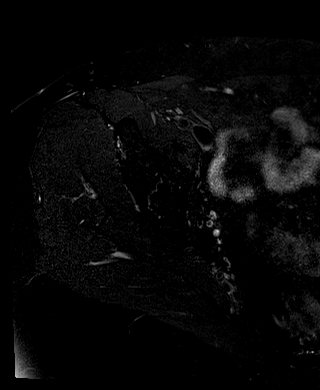

[Series 16: PD fat-sat · coronal · right · 3.0mm · 0.56mm/px · 7 of 28 slices shown (1 of 2)]
[im 1/28]
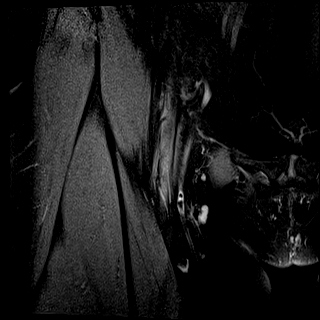
[im 5/28]
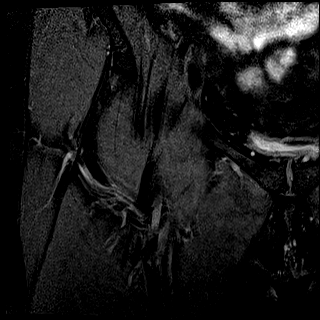
[im 10/28]
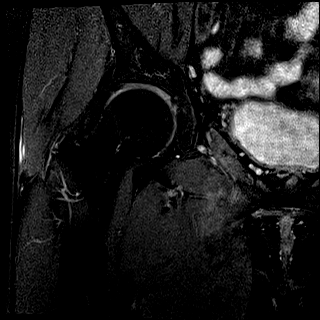
[im 14/28]
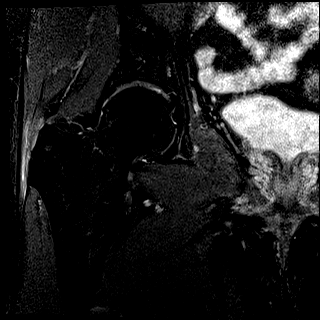
[im 19/28]
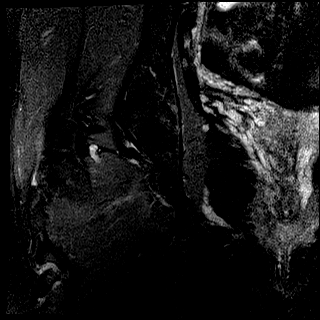
[im 23/28]
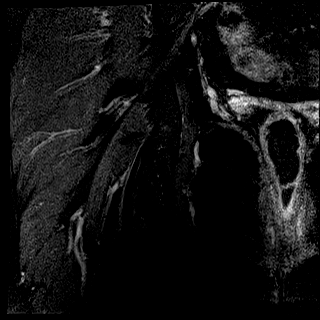
[im 28/28]
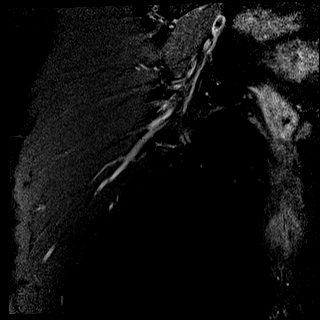

[Series 17: PD fat-sat · sagittal · right · 3.0mm · 0.56mm/px · 9 of 35 slices shown (2 of 2)]
[im 1/35]
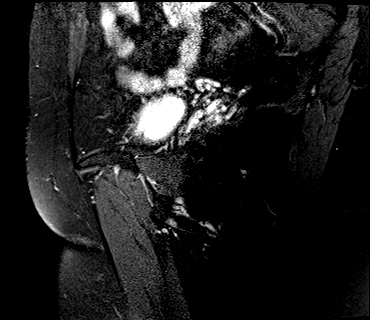
[im 5/35]
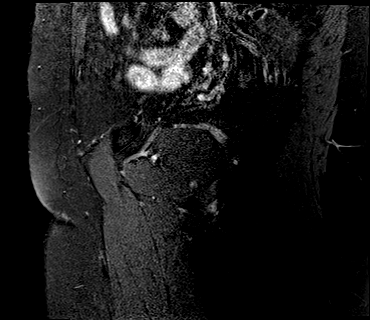
[im 9/35]
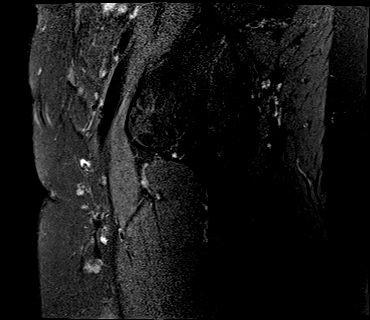
[im 13/35]
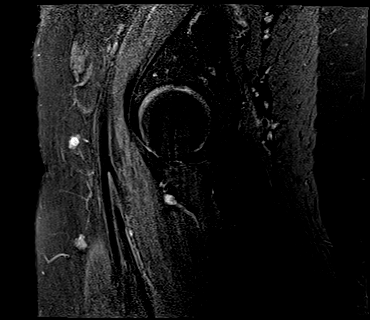
[im 18/35]
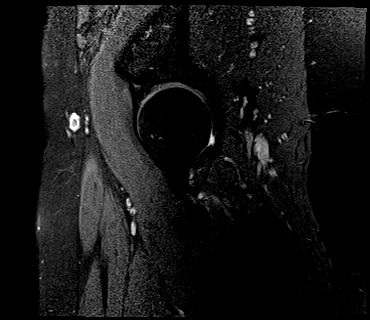
[im 22/35]
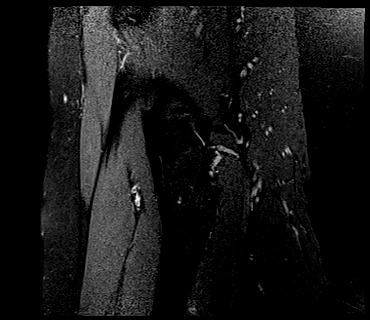
[im 26/35]
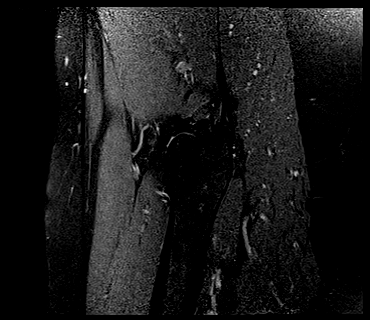
[im 30/35]
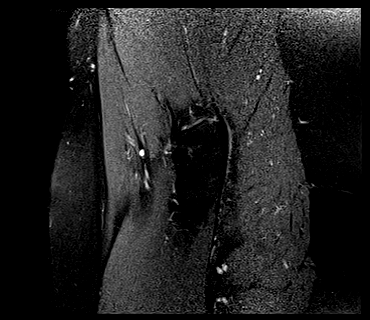
[im 35/35]
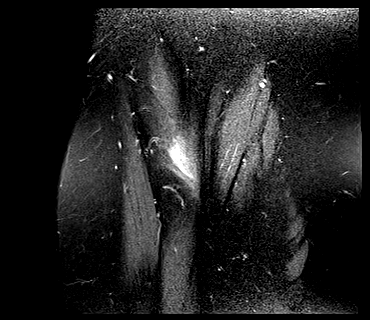

[35 of 40 positions shown; findings below may reference images not displayed]

FINDINGS: Bones: No fracture, stress change or worrisome lesion is identified.
There is no avascular necrosis of the femoral heads. No subchondral
cyst formation or edema about the hips is seen.

Articular cartilage and labrum

Articular cartilage:  Mildly degenerated without focal defect.

Labrum:  Intact.

Joint or bursal effusion

Joint effusion:  None.

Bursae: There is fluid in the trochanteric bursa bilaterally.

Muscles and tendons

Muscles and tendons: Intact. Mildly increased T2 signal is seen in
the distal gluteus medius tendon on the right and at the right
hamstring origin.

Other findings

Miscellaneous: Imaged intrapelvic contents demonstrate postoperative
change of hysterectomy.
IMPRESSION: Fluid in the trochanteric bursa bilaterally consistent with
bursitis.

Mild tendinosis of the right gluteus medius and right hamstrings at
their origin without tear.

Very mild right hip degenerative disease.

## 2020-12-26 ENCOUNTER — Other Ambulatory Visit: Payer: BLUE CROSS/BLUE SHIELD

## 2020-12-27 ENCOUNTER — Other Ambulatory Visit: Payer: BC Managed Care – PPO

## 2020-12-27 ENCOUNTER — Other Ambulatory Visit: Payer: Self-pay

## 2020-12-27 DIAGNOSIS — Z20822 Contact with and (suspected) exposure to covid-19: Secondary | ICD-10-CM

## 2020-12-29 LAB — NOVEL CORONAVIRUS, NAA: SARS-CoV-2, NAA: NOT DETECTED

## 2020-12-29 LAB — SARS-COV-2, NAA 2 DAY TAT

## 2021-08-24 ENCOUNTER — Other Ambulatory Visit: Payer: Self-pay | Admitting: Family Medicine

## 2021-08-24 ENCOUNTER — Other Ambulatory Visit (HOSPITAL_COMMUNITY): Payer: Self-pay | Admitting: Family Medicine

## 2021-08-24 DIAGNOSIS — R519 Headache, unspecified: Secondary | ICD-10-CM

## 2021-09-20 ENCOUNTER — Other Ambulatory Visit: Payer: Self-pay | Admitting: Student

## 2021-09-20 DIAGNOSIS — G43719 Chronic migraine without aura, intractable, without status migrainosus: Secondary | ICD-10-CM

## 2021-09-29 ENCOUNTER — Ambulatory Visit: Payer: BC Managed Care – PPO

## 2022-09-20 ENCOUNTER — Other Ambulatory Visit: Payer: Self-pay | Admitting: Family Medicine

## 2022-09-20 DIAGNOSIS — M25551 Pain in right hip: Secondary | ICD-10-CM

## 2022-10-04 ENCOUNTER — Other Ambulatory Visit: Payer: BC Managed Care – PPO

## 2022-10-07 ENCOUNTER — Ambulatory Visit
Admission: RE | Admit: 2022-10-07 | Discharge: 2022-10-07 | Disposition: A | Discharge status: Home / Self Care | Payer: BC Managed Care – PPO | Source: Ambulatory Visit | Attending: Family Medicine | Admitting: Family Medicine

## 2022-10-07 DIAGNOSIS — M25551 Pain in right hip: Secondary | ICD-10-CM

## 2022-11-12 DIAGNOSIS — M7061 Trochanteric bursitis, right hip: Secondary | ICD-10-CM | POA: Insufficient documentation

## 2022-11-12 DIAGNOSIS — M25551 Pain in right hip: Secondary | ICD-10-CM | POA: Insufficient documentation

## 2023-03-24 ENCOUNTER — Other Ambulatory Visit (HOSPITAL_BASED_OUTPATIENT_CLINIC_OR_DEPARTMENT_OTHER): Payer: Self-pay

## 2023-03-25 ENCOUNTER — Other Ambulatory Visit (HOSPITAL_BASED_OUTPATIENT_CLINIC_OR_DEPARTMENT_OTHER): Payer: Self-pay

## 2023-03-25 MED ORDER — SEMAGLUTIDE-WEIGHT MANAGEMENT 0.25 MG/0.5ML ~~LOC~~ SOAJ
0.2500 mg | SUBCUTANEOUS | 0 refills | Status: AC
  Filled 2023-03-25: qty 2, 28d supply, fill #0

## 2023-03-26 ENCOUNTER — Other Ambulatory Visit (HOSPITAL_BASED_OUTPATIENT_CLINIC_OR_DEPARTMENT_OTHER): Payer: Self-pay

## 2023-03-26 ENCOUNTER — Encounter (HOSPITAL_BASED_OUTPATIENT_CLINIC_OR_DEPARTMENT_OTHER): Payer: Self-pay

## 2023-04-09 ENCOUNTER — Other Ambulatory Visit (HOSPITAL_BASED_OUTPATIENT_CLINIC_OR_DEPARTMENT_OTHER): Payer: Self-pay

## 2023-04-09 MED ORDER — WEGOVY 0.5 MG/0.5ML ~~LOC~~ SOAJ
0.5000 mg | SUBCUTANEOUS | 0 refills | Status: AC
  Filled 2023-04-09: qty 2, 28d supply, fill #0

## 2023-05-01 DIAGNOSIS — M7918 Myalgia, other site: Secondary | ICD-10-CM | POA: Insufficient documentation

## 2023-05-07 ENCOUNTER — Other Ambulatory Visit (HOSPITAL_BASED_OUTPATIENT_CLINIC_OR_DEPARTMENT_OTHER): Payer: Self-pay

## 2023-05-07 MED ORDER — WEGOVY 1 MG/0.5ML ~~LOC~~ SOAJ
1.0000 mg | SUBCUTANEOUS | 0 refills | Status: AC
  Filled 2023-05-07: qty 2, 28d supply, fill #0

## 2023-06-04 ENCOUNTER — Other Ambulatory Visit (HOSPITAL_BASED_OUTPATIENT_CLINIC_OR_DEPARTMENT_OTHER): Payer: Self-pay

## 2023-06-04 MED ORDER — WEGOVY 1.7 MG/0.75ML ~~LOC~~ SOAJ
1.7000 mg | SUBCUTANEOUS | 1 refills | Status: DC
  Filled 2023-06-04: qty 3, 28d supply, fill #0

## 2023-07-10 ENCOUNTER — Other Ambulatory Visit (HOSPITAL_BASED_OUTPATIENT_CLINIC_OR_DEPARTMENT_OTHER): Payer: Self-pay

## 2023-07-10 MED ORDER — WEGOVY 2.4 MG/0.75ML ~~LOC~~ SOAJ
2.4000 mg | SUBCUTANEOUS | 0 refills | Status: DC
  Filled 2023-07-10: qty 3, 28d supply, fill #0

## 2023-07-11 ENCOUNTER — Other Ambulatory Visit: Payer: Self-pay

## 2023-08-05 ENCOUNTER — Other Ambulatory Visit (HOSPITAL_BASED_OUTPATIENT_CLINIC_OR_DEPARTMENT_OTHER): Payer: Self-pay

## 2023-08-05 MED ORDER — WEGOVY 2.4 MG/0.75ML ~~LOC~~ SOAJ
2.4000 mg | SUBCUTANEOUS | 1 refills | Status: AC
  Filled 2023-08-05: qty 3, 28d supply, fill #0

## 2023-09-02 ENCOUNTER — Other Ambulatory Visit (HOSPITAL_BASED_OUTPATIENT_CLINIC_OR_DEPARTMENT_OTHER): Payer: Self-pay

## 2023-09-02 MED ORDER — WEGOVY 2.4 MG/0.75ML ~~LOC~~ SOAJ
SUBCUTANEOUS | 1 refills | Status: AC
  Filled 2023-09-02: qty 3, 28d supply, fill #0

## 2023-10-02 ENCOUNTER — Other Ambulatory Visit (HOSPITAL_BASED_OUTPATIENT_CLINIC_OR_DEPARTMENT_OTHER): Payer: Self-pay

## 2023-10-02 MED ORDER — WEGOVY 2.4 MG/0.75ML ~~LOC~~ SOAJ
SUBCUTANEOUS | 1 refills | Status: AC
  Filled 2023-10-02: qty 3, 28d supply, fill #0
  Filled 2023-11-26: qty 3, 28d supply, fill #1

## 2023-10-09 ENCOUNTER — Other Ambulatory Visit: Payer: Self-pay

## 2023-11-03 ENCOUNTER — Other Ambulatory Visit (HOSPITAL_BASED_OUTPATIENT_CLINIC_OR_DEPARTMENT_OTHER): Payer: Self-pay

## 2023-11-03 MED ORDER — WEGOVY 2.4 MG/0.75ML ~~LOC~~ SOAJ
2.4000 mg | SUBCUTANEOUS | 1 refills | Status: AC
  Filled 2023-11-03: qty 3, 28d supply, fill #0

## 2023-11-26 ENCOUNTER — Other Ambulatory Visit (HOSPITAL_BASED_OUTPATIENT_CLINIC_OR_DEPARTMENT_OTHER): Payer: Self-pay

## 2023-11-26 MED ORDER — WEGOVY 2.4 MG/0.75ML ~~LOC~~ SOAJ: 2.4000 mg | SUBCUTANEOUS | 1 refills | Status: AC

## 2023-12-31 ENCOUNTER — Other Ambulatory Visit (HOSPITAL_BASED_OUTPATIENT_CLINIC_OR_DEPARTMENT_OTHER): Payer: Self-pay

## 2023-12-31 MED ORDER — WEGOVY 2.4 MG/0.75ML ~~LOC~~ SOAJ
2.4000 mg | SUBCUTANEOUS | 1 refills | Status: DC
  Filled 2023-12-31: qty 3, 28d supply, fill #0

## 2024-01-07 ENCOUNTER — Other Ambulatory Visit (HOSPITAL_BASED_OUTPATIENT_CLINIC_OR_DEPARTMENT_OTHER): Payer: Self-pay

## 2024-01-08 ENCOUNTER — Other Ambulatory Visit (HOSPITAL_BASED_OUTPATIENT_CLINIC_OR_DEPARTMENT_OTHER): Payer: Self-pay

## 2024-01-08 ENCOUNTER — Ambulatory Visit: Payer: No Typology Code available for payment source | Admitting: Dermatology

## 2024-01-08 ENCOUNTER — Encounter: Payer: Self-pay | Admitting: Dermatology

## 2024-01-08 VITALS — BP 115/71 | HR 81

## 2024-01-08 DIAGNOSIS — D229 Melanocytic nevi, unspecified: Secondary | ICD-10-CM

## 2024-01-08 DIAGNOSIS — L709 Acne, unspecified: Secondary | ICD-10-CM

## 2024-01-08 DIAGNOSIS — L814 Other melanin hyperpigmentation: Secondary | ICD-10-CM | POA: Diagnosis not present

## 2024-01-08 DIAGNOSIS — L821 Other seborrheic keratosis: Secondary | ICD-10-CM

## 2024-01-08 DIAGNOSIS — L570 Actinic keratosis: Secondary | ICD-10-CM | POA: Diagnosis not present

## 2024-01-08 DIAGNOSIS — Z1283 Encounter for screening for malignant neoplasm of skin: Secondary | ICD-10-CM | POA: Diagnosis not present

## 2024-01-08 DIAGNOSIS — L700 Acne vulgaris: Secondary | ICD-10-CM

## 2024-01-08 DIAGNOSIS — W908XXA Exposure to other nonionizing radiation, initial encounter: Secondary | ICD-10-CM | POA: Diagnosis not present

## 2024-01-08 DIAGNOSIS — D1801 Hemangioma of skin and subcutaneous tissue: Secondary | ICD-10-CM | POA: Diagnosis not present

## 2024-01-08 DIAGNOSIS — L578 Other skin changes due to chronic exposure to nonionizing radiation: Secondary | ICD-10-CM

## 2024-01-08 MED ORDER — TRETINOIN 0.1 % EX CREA: TOPICAL_CREAM | Freq: Every day | CUTANEOUS | 3 refills | Status: AC

## 2024-01-08 NOTE — Progress Notes (Signed)
New Patient Visit   Subjective  Veronica [name] is a 53 y.o. female who presents for the following:  Total Body Skin Exam (TBSE)  Patient present today for new patient visit for TBSE.The patient reports she has spots, moles and lesions to be evaluated, some may be new or changing and the patient may have concern these could be cancer. Patient has previously been treated by a dermatologist. Patient reports she has hx of bx. Patient reports family history of skin cancers. Patient reports throughout her lifetime has had moderate sun exposure. Currently, patient reports if she has excessive sun exposure, she does apply sunscreen and/or wears protective coverings.  The following portions of the chart were reviewed this encounter and updated as appropriate: medications, allergies, medical history  Review of Systems:  No other skin or systemic complaints except as noted in HPI or Assessment and Plan.  Objective  Well appearing patient in no apparent distress; mood and affect are within normal limits.  A full examination was performed including scalp, head, eyes, ears, nose, lips, neck, chest, axillae, abdomen, back, buttocks, bilateral upper extremities, bilateral lower extremities, hands, feet, fingers, toes, fingernails, and toenails. All findings within normal limits unless otherwise noted below.   Relevant exam findings are noted in the Assessment and Plan.  Left Upper Arm - Anterior, Right Anterior Mandible, Right Buccal Cheek (2) Erythematous thin papules/macules with gritty scale   Assessment & Plan   LENTIGINES, SEBORRHEIC KERATOSES, HEMANGIOMAS - Benign normal skin lesions - Benign-appearing - Call for any changes  MELANOCYTIC NEVI - Tan-brown and/or pink-flesh-colored symmetric macules and papules - Benign appearing on exam today - Observation - Call clinic for new or changing moles - Recommend daily use of broad spectrum spf 30+ sunscreen to sun-exposed areas.   MILD ACTINIC  DAMAGE - Chronic condition, secondary to cumulative UV/sun exposure - diffuse scaly erythematous macules with underlying dyspigmentation - Recommend daily broad spectrum sunscreen SPF 30+ to sun-exposed areas, reapply every 2 hours as needed.  - Staying in the shade or wearing long sleeves, sun glasses (UVA+UVB protection) and wide brim hats (4-inch brim around the entire circumference of the hat) are also recommended for sun protection.  - Call for new or changing lesions.  ACTINIC KERATOSIS Exam: Erythematous thin papules/macules with gritty scale  Actinic keratoses are precancerous spots that appear secondary to cumulative UV radiation exposure/sun exposure over time. They are chronic with expected duration over 1 year. A portion of actinic keratoses will progress to squamous cell carcinoma of the skin. It is not possible to reliably predict which spots will progress to skin cancer and so treatment is recommended to prevent development of skin cancer.  Recommend daily broad spectrum sunscreen SPF 30+ to sun-exposed areas, reapply every 2 hours as needed.  Recommend staying in the shade or wearing long sleeves, sun glasses (UVA+UVB protection) and wide brim hats (4-inch brim around the entire circumference of the hat). Call for new or changing lesions.  Treatment Plan:  Prior to procedure, discussed risks of blister formation, small wound, skin dyspigmentation, or rare scar following cryotherapy. Recommend Vaseline ointment to treated areas while healing.  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  Not at goal  Treatment Plan: - We will prescribe Tretinoin 0.1% 1 night per week, once the warmer weather starts you can increase to 2-3 nights weekly   SKIN CANCER SCREENING PERFORMED TODAY AK (ACTINIC KERATOSIS) (4) Left Upper Arm - Anterior, Right Anterior Mandible, Right Buccal Cheek (2) Destruction  of lesion - Left Upper Arm - Anterior, Right Anterior Mandible, Right  Buccal Cheek (2) Complexity: simple   Destruction method: cryotherapy   Informed consent: discussed and consent obtained   Timeout:  patient name, date of birth, surgical site, and procedure verified Lesion destroyed using liquid nitrogen: Yes   Cryotherapy cycles:  3 Post-procedure details: wound care instructions given   ACNE, UNSPECIFIED ACNE TYPE   Related Medications tretinoin (RETIN-A) 0.1 % cream Apply topically at bedtime.  Return in about 1 year (around 01/07/2025) for TBSE.  Documentation: I have reviewed the above documentation for accuracy and completeness, and I agree with the above.  Stasia Cavalier, am acting as scribe for Langston Reusing, DO.   Langston Reusing, DO

## 2024-01-08 NOTE — Patient Instructions (Addendum)

## 2024-10-04 ENCOUNTER — Other Ambulatory Visit: Payer: Self-pay | Admitting: Family

## 2024-10-04 DIAGNOSIS — R319 Hematuria, unspecified: Secondary | ICD-10-CM

## 2024-10-06 ENCOUNTER — Inpatient Hospital Stay: Admission: RE | Admit: 2024-10-06

## 2024-10-08 ENCOUNTER — Ambulatory Visit
Admission: RE | Admit: 2024-10-08 | Discharge: 2024-10-08 | Disposition: A | Discharge status: Home / Self Care | Source: Ambulatory Visit | Attending: Family | Admitting: Family

## 2024-10-08 DIAGNOSIS — R319 Hematuria, unspecified: Secondary | ICD-10-CM

## 2024-10-08 MED ORDER — IOPAMIDOL (ISOVUE-300) INJECTION 61%
125.0000 mL | Freq: Once | INTRAVENOUS | Status: AC | PRN
  Administered 2024-10-08: 125 mL via INTRAVENOUS

## 2024-11-13 NOTE — Progress Notes (Unsigned)
 11/15/2024 8:16 PM   Veronica [name] December 09, 1971 [id number]  Referring provider: Ann Darice HERO, FNP [address] [place],  [place] [address]  Urological history: 1. 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  HPI: Veronica [name] is a 53 y.o. woman who presents today for UTI symptoms with negative urine cultures.   Previous records reviewed.  They are having (1 to 7) or (8 or more) daytime voids,  they are having nocturia (1-2) or (3 or more) and urgency is (none, mild, strong, severe).   They are having (stress, urge or mixed incontinence.)    they are having urinary leakage (1-2 times weekly, 3 or more times weekly, 1-2 times daily and 3 or more times daily) They are using absorbent products for leakage (no, sometimes, always )   the type of products they use are (panty liners, absorbant pads, depends) *** daily.  They are not limiting fluids.  They are not engaging in toilet mapping  ***  CT w/wo no worrisome urological findings  UA ***  PVR ***  PMH: Past Medical History:  Diagnosis Date   Arm pain, right    from pinched nerve in neck   Arthritis    neck   Dyspnea    WITH EXERTION-STRESS TEST DONE 05-2016 BY DR EARTHA-   GERD (gastroesophageal reflux disease)    H/O   Headache    from pinched nerve in neck   Pain    OCCC TWING,SHARPE PAIN IN CHEST   Pinched nerve in neck    limited right head turn   PONV (postoperative nausea and vomiting)     Surgical History: Past Surgical History:  Procedure Laterality Date   ABDOMINAL HYSTERECTOMY  10/23/15   CARPAL TUNNEL RELEASE Right 07/03/2016   Procedure: CARPAL TUNNEL RELEASE;  Surgeon: Lynwood SHAUNNA Hue, MD;  Location: [facility];  Service: Orthopedics;  Laterality: Right;   CARPAL TUNNEL RELEASE Left 11/04/2016   Procedure: CARPAL TUNNEL RELEASE;  Surgeon: Lynwood SHAUNNA Hue, MD;  Location: [facility];  Service: Orthopedics;  Laterality: Left;   COLLATERAL LIGAMENT REPAIR, ELBOW      COLONOSCOPY N/A 01/02/2016   Procedure: COLONOSCOPY;  Surgeon: Deward CINDERELLA Piedmont, MD;  Location: [facility];  Service: Gastroenterology;  Laterality: N/A;   DILATION AND CURETTAGE OF UTERUS     DILITATION & CURRETTAGE/HYSTROSCOPY WITH VERSAPOINT RESECTION N/A 03/18/2014   Procedure: DILATATION & CURETTAGE/HYSTEROSCOPY WITH VERSAPOINT RESECTION;  Surgeon: Charlie JINNY Flowers, MD;  Location: [facility];  Service: Gynecology;  Laterality: N/A;  90 min. total   ESOPHAGOGASTRODUODENOSCOPY N/A 01/02/2016   Procedure: ESOPHAGOGASTRODUODENOSCOPY (EGD);  Surgeon: Deward CINDERELLA Piedmont, MD;  Location: [facility];  Service: Gastroenterology;  Laterality: N/A;   NOVASURE ABLATION N/A 03/18/2014   Procedure: NOVASURE ABLATION;  Surgeon: Charlie JINNY Flowers, MD;  Location: [facility];  Service: Gynecology;  Laterality: N/A;   RECTOCELE REPAIR N/A 03/18/2014   Procedure: POSTERIOR REPAIR (RECTOCELE)/Perineorrhaphy;  Surgeon: Charlie JINNY Flowers, MD;  Location: [facility];  Service: Gynecology;  Laterality: N/A;   SPINAL FUSION     TONSILLECTOMY     TUBAL LIGATION      Home Medications:  Allergies as of 11/15/2024       Reactions   Penicillins Anaphylaxis   Has patient had a PCN reaction causing immediate rash, facial/tongue/throat swelling, SOB or lightheadedness with hypotension: Yes Has patient had a PCN reaction causing severe rash involving mucus membranes or skin necrosis: Yes  Has patient had a PCN reaction that required hospitalization No Has patient had a PCN reaction occurring within the last 10 years: No If all of the above answers are NO, then may proceed with Cephalosporin use. stopped breathing   Amoxicillin Diarrhea, Rash   Has patient had a PCN reaction causing immediate rash, facial/tongue/throat swelling, SOB or lightheadedness with hypotension: Yes Has patient had a PCN reaction causing severe rash involving mucus membranes or skin necrosis: No Has patient had a PCN reaction that required hospitalization  No Has patient had a PCN reaction occurring within the last 10 years: no If all of the above answers are NO, then may proceed with Cephalosporin use.   Oxycodone  Itching   Tylenol  [acetaminophen ]    Makes throat itch. No trouble breathing        Medication List        Accurate as of November 13, 2024  8:16 PM. If you have any questions, ask your nurse or doctor.          amphetamine-dextroamphetamine 7.5 MG tablet Commonly known as: ADDERALL Take 7.5 mg by mouth daily.   cetirizine 10 MG tablet Commonly known as: ZYRTEC Take 10 mg by mouth daily.   cyclobenzaprine  10 MG tablet Commonly known as: FLEXERIL  Take 1 tablet (10 mg total) by mouth 3 (three) times daily as needed for muscle spasms.   D 1000 25 MCG (1000 UT) capsule Generic drug: Cholecalciferol Take by mouth.   Daily Vitamins tablet Take 1 tablet by mouth daily.   ibuprofen 200 MG tablet Commonly known as: ADVIL Take by mouth.   loratadine 10 MG tablet Commonly known as: CLARITIN Take by mouth.   melatonin 5 MG Tabs Take 5 mg by mouth at bedtime.   naproxen  500 MG tablet Commonly known as: NAPROSYN  Take 1 tablet (500 mg total) by mouth 2 (two) times daily with a meal.   Potassium 99 MG Tabs Take by mouth.   predniSONE  10 MG (21) Tbpk tablet Commonly known as: STERAPRED UNI-PAK 21 TAB As directed x 6 days   tretinoin  0.1 % cream Commonly known as: RETIN-A  Apply topically at bedtime.   Ubrelvy 100 MG Tabs Generic drug: Ubrogepant TAKE 1 TABLET BY MOUTH AS DIRECTED. MAY TAKE SECOND DOSE 2 HOURS AFTER THE FIRST AS NEEDED   Wegovy  0.25 MG/0.5ML Soaj SQ injection Generic drug: semaglutide -weight management Inject 0.25 mg into the skin once a week.   Wegovy  0.5 MG/0.5ML Soaj SQ injection Generic drug: semaglutide -weight management Inject 0.5 mg into the skin once a week.   Wegovy  1 MG/0.5ML Soaj SQ injection Generic drug: semaglutide -weight management Inject 1 mg into the skin once a  week.   Wegovy  2.4 MG/0.75ML Soaj SQ injection Generic drug: semaglutide -weight management Inject 2.4 mg into the skin once a week.   Wegovy  2.4 MG/0.75ML Soaj SQ injection Generic drug: semaglutide -weight management Inject 2.4 mg every week by subcutaneous route for 28 days.   Wegovy  2.4 MG/0.75ML Soaj SQ injection Generic drug: semaglutide -weight management Inject 2.4 mg every week by subcutaneous route for 28 days.   Wegovy  2.4 MG/0.75ML Soaj SQ injection Generic drug: semaglutide -weight management Inject 2.4 mg into the skin once a week.   Wegovy  2.4 MG/0.75ML Soaj SQ injection Generic drug: semaglutide -weight management Inject 2.4 mg into the skin once a week.        Allergies:  Allergies  Allergen Reactions   Penicillins Anaphylaxis    Has patient had a PCN reaction causing immediate rash, facial/tongue/throat swelling, SOB  or lightheadedness with hypotension: Yes Has patient had a PCN reaction causing severe rash involving mucus membranes or skin necrosis: Yes Has patient had a PCN reaction that required hospitalization No Has patient had a PCN reaction occurring within the last 10 years: No If all of the above answers are NO, then may proceed with Cephalosporin use.  stopped breathing   Amoxicillin Diarrhea and Rash    Has patient had a PCN reaction causing immediate rash, facial/tongue/throat swelling, SOB or lightheadedness with hypotension: Yes Has patient had a PCN reaction causing severe rash involving mucus membranes or skin necrosis: No Has patient had a PCN reaction that required hospitalization No Has patient had a PCN reaction occurring within the last 10 years: no If all of the above answers are NO, then may proceed with Cephalosporin use.    Oxycodone  Itching   Tylenol  [Acetaminophen ]     Makes throat itch. No trouble breathing    Family History: Family History  Problem Relation Age of Onset   Skin cancer Father    Cancer Brother     Breast cancer Maternal Aunt     Social History:  reports that she has never smoked. She has never been exposed to tobacco smoke. She has never used smokeless tobacco. She reports that she does not drink alcohol and does not use drugs.  ROS: Pertinent ROS in HPI  Physical Exam: LMP 02/20/2014   Constitutional:  Well nourished. Alert and oriented, No acute distress. HEENT: [place] AT, moist mucus membranes.  Trachea midline, no masses. Cardiovascular: No clubbing, cyanosis, or edema. Respiratory: Normal respiratory effort, no increased work of breathing. GU: No CVA tenderness.  No bladder fullness or masses.  Recession of labia minora, dry, pale vulvar vaginal mucosa and loss of mucosal ridges and folds.  Normal urethral meatus, no lesions, no prolapse, no discharge.   No urethral masses, tenderness and/or tenderness. No bladder fullness, tenderness or masses. *** vagina mucosa, *** estrogen effect, no discharge, no lesions, *** pelvic support, *** cystocele and *** rectocele noted.  No cervical motion tenderness.  Uterus is freely mobile and non-fixed.  No adnexal/parametria masses or tenderness noted.  Anus and perineum are without rashes or lesions.   ***  Neurologic: Grossly intact, no focal deficits, moving all 4 extremities. Psychiatric: Normal mood and affect.    Laboratory Data: See Epic and HPI   I have reviewed the labs.   Pertinent Imaging: CLINICAL DATA:  Hematuria.   EXAM: CT ABDOMEN AND PELVIS WITHOUT AND WITH CONTRAST   TECHNIQUE: Multidetector CT imaging of the abdomen and pelvis was performed following the standard protocol before and following the bolus administration of intravenous contrast.   RADIATION DOSE REDUCTION: This exam was performed according to the departmental dose-optimization program which includes automated exposure control, adjustment of the mA and/or kV according to patient size and/or use of iterative reconstruction technique.   CONTRAST:  125mL  ISOVUE -300 IOPAMIDOL  (ISOVUE -300) INJECTION 61%   COMPARISON:  None Available.   FINDINGS: Lower chest: There are dependent changes in the visualized lung bases. No overt consolidation. No pleural effusion. The heart is normal in size. No pericardial effusion.   Hepatobiliary: The liver is normal in size. Non-cirrhotic configuration. There are multiple scattered hypoattenuating structures in the liver. The largest structure is in the subcapsular left hepatic lobe, segment 2 measuring up to 8 x 12 mm, which can be characterized as a simple cyst. The other structures are too small to be adequately characterized but favored to represent  cysts as well. No intrahepatic or extrahepatic bile duct dilation. No calcified gallstones. Normal gallbladder wall thickness. No pericholecystic inflammatory changes.   Pancreas: Unremarkable. No pancreatic ductal dilatation or surrounding inflammatory changes.   Spleen: Within normal limits. No focal lesion.   Adrenals/Urinary Tract: Adrenal glands are unremarkable.   Non-contrast images: No radiopaque urinary tract calculi.   Kidneys: Symmetric enhancement. No suspicious renal mass.   Urinary Tract Opacification: Adequate.   Collecting Systems and Ureters: No filling defects, masses, strictures, or areas of abnormal dilatation. Bilateral extrarenal pelves noted.   Urinary Bladder: No filling defect, wall thickening, or mass.   Stomach/Bowel: No disproportionate dilation of the small or large bowel loops. No evidence of abnormal bowel wall thickening or inflammatory changes. The appendix is unremarkable.   Vascular/Lymphatic: No ascites or pneumoperitoneum. No abdominal or pelvic lymphadenopathy, by size criteria. No aneurysmal dilation of the major abdominal arteries. There is mild asymmetric fat stranding predominantly surrounding the origin of the celiac trunk. This is nonspecific. There is no associated focal dissection,  aneurysmal dilation or focal stenosis of the artery. Correlate clinically.   Reproductive: The uterus is surgically absent. No large adnexal mass.   Other: There is a tiny fat containing umbilical hernia. The soft tissues and abdominal wall are otherwise unremarkable.   Musculoskeletal: No suspicious osseous lesions. There are mild multilevel degenerative changes in the visualized spine. 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   IMPRESSION: 1. No nephroureterolithiasis or obstructive uropathy. No suspicious renal, ureteric or urinary bladder mass. 2. Mild asymmetric fat stranding surrounding the origin of the celiac trunk without abnormality in the underlying artery. Correlate clinically. 3. Multiple other nonacute observations, as described above.     Electronically Signed   By: Ree Molt M.D.   On: 10/08/2024 14:39  *** I have independently reviewed the films.  See HPI.    Assessment & Plan:  ***  1. LU TS - UA*** - PVR *** - will need an appointment with Dr. MacDiarmid for cystoscopy to evaluate for sling erosion -Explained that the cystoscopy is a safe and common diagnostic test performed by one of our physicians in the office.  It consist of using a thin, lighted tube to look directly inside the bladder and urethra to evaluate the anatomy.  The procedure is brief, typically taking about 5 minutes. -This will enable us  to assess bladder health and rule out other bladder conditions (stricture disease, stones, cancer, etc.)  -Advised the patient that there are no restrictions to eating or drinking prior to the cystoscopy -They can continue to take all of their medications as prescribed -They can drive themselves to and from the appointment -I explained that during the procedure, the area around the urethra will be cleansed thoroughly, topical anesthetic will be applied to numb your urethra, the thin tube is then gently inserted through the urethra  into your bladder while fluid flows through the tube to the bladder to enable better visualization -I explained the procedure is usually not painful, however there may be some discomfort (pinching feeling), and they may feel an urge to urinate, coolness or fullness in the bladder and then the cystoscope is removed -After the cystoscopy, I advised them that they may experience urinary frequency, hematuria, dysuria which will resolve within 24 to 48 hours -Reviewed red flag signs (fever, bright red blood or blood clots in the urine, abdominal pain or difficulty urinating) and to contact the office immediately or seek treatment in the ED if they should  experience any of these -The physician will discuss the results of the cystoscopy at the time of the procedure   No follow-ups on file.  These notes generated with voice recognition software. I apologize for typographical errors.  Veronica [name]  [facility] [address]  [address] [place], [place] [address] [phone]

## 2024-11-15 ENCOUNTER — Ambulatory Visit: Admitting: Urology

## 2024-11-15 ENCOUNTER — Encounter: Payer: Self-pay | Admitting: Urology

## 2024-11-15 VITALS — BP 108/73 | HR 80 | Wt 153.0 lb

## 2024-11-15 DIAGNOSIS — R310 Gross hematuria: Secondary | ICD-10-CM

## 2024-11-15 DIAGNOSIS — R319 Hematuria, unspecified: Secondary | ICD-10-CM | POA: Diagnosis not present

## 2024-11-15 DIAGNOSIS — N390 Urinary tract infection, site not specified: Secondary | ICD-10-CM | POA: Diagnosis not present

## 2024-11-15 LAB — BLADDER SCAN AMB NON-IMAGING

## 2024-11-15 NOTE — Patient Instructions (Signed)
 Cystoscopy Cystoscopy is a procedure that is used to help diagnose and sometimes treat conditions that affect the lower urinary tract. The lower urinary tract includes the bladder and the urethra. The urethra is the tube that drains urine from the bladder. Cystoscopy is done using a thin, tube-shaped instrument with a light and camera at the end (cystoscope). The cystoscope may be hard or flexible, depending on the goal of the procedure. The cystoscope is inserted through the urethra, into the bladder. Cystoscopy may be recommended if you have: Urinary tract infections that keep coming back. Blood in the urine (hematuria). An inability to control when you urinate (urinary incontinence) or an overactive bladder. Unusual cells found in a urine sample. A blockage in the urethra, such as a urinary stone. Painful urination. An abnormality in the bladder found during an intravenous pyelogram (IVP) or CT scan. What are the risks? Generally, this is a safe procedure. However, problems may occur, including: Infection. Bleeding.  What happens during the procedure?  You will be given one or more of the following: A medicine to numb the area (local anesthetic). The area around the opening of your urethra will be cleaned. The cystoscope will be passed through your urethra into your bladder. Germ-free (sterile) fluid will flow through the cystoscope to fill your bladder. The fluid will stretch your bladder so that your health care provider can clearly examine your bladder walls. Your doctor will look at the urethra and bladder. The cystoscope will be removed The procedure may vary among health care providers  What can I expect after the procedure? After the procedure, it is common to have: Some soreness or pain in your urethra. Urinary symptoms. These include: Mild pain or burning when you urinate. Pain should stop within a few minutes after you urinate. This may last for up to a few days after the  procedure. A small amount of blood in your urine for several days. Feeling like you need to urinate but producing only a small amount of urine. Follow these instructions at home: General instructions Return to your normal activities as told by your health care provider.  Drink plenty of fluids after the procedure. Keep all follow-up visits as told by your health care provider. This is important. Contact a health care provider if you: Have pain that gets worse or does not get better with medicine, especially pain when you urinate lasting longer than 72 hours after the procedure. Have trouble urinating. Get help right away if you: Have blood clots in your urine. Have a fever or chills. Are unable to urinate. Summary Cystoscopy is a procedure that is used to help diagnose and sometimes treat conditions that affect the lower urinary tract. Cystoscopy is done using a thin, tube-shaped instrument with a light and camera at the end. After the procedure, it is common to have some soreness or pain in your urethra. It is normal to have blood in your urine after the procedure.  If you were prescribed an antibiotic medicine, take it as told by your health care provider.  This information is not intended to replace advice given to you by your health care provider. Make sure you discuss any questions you have with your health care provider. Document Revised: 11/17/2018 Document Reviewed: 11/17/2018 Elsevier Patient Education  2020 [employer]

## 2024-11-16 LAB — URINALYSIS, COMPLETE
Bilirubin, UA: NEGATIVE
Glucose, UA: NEGATIVE
Ketones, UA: NEGATIVE
Nitrite, UA: NEGATIVE
Protein,UA: NEGATIVE
RBC, UA: NEGATIVE
Specific Gravity, UA: <1.005 — ABNORMAL LOW (ref 1.005–1.030)
Urobilinogen, Ur: 0.2 mg/dL (ref 0.2–1.0)
pH, UA: 6 (ref 5.0–7.5)

## 2024-11-16 LAB — MICROSCOPIC EXAMINATION: RBC, Urine: NONE SEEN /HPF (ref 0–2)

## 2024-11-22 NOTE — Progress Notes (Unsigned)
° °  11/24/2024 9:49 AM   Veronica [name] 1971-11-28 [id number]  Cystoscopy Procedure Note:  Indication:  Hx of MUS (2020)  - s/p removal d/t erosion (2021)  Hx of OAB, rUTI Hx of isolated GH, no AMH (Oct 2025)   After informed consent and discussion of the procedure and its risks, Veronica [name] was positioned and prepped in the standard fashion. Cystoscopy was performed with a flexible cystoscope. The external vaginal anatomy, urethra, bladder neck and bladder mucosa were visualized in a systematic fashion. The ureteral orifices were noted in orthotopic location and orientation. There were no bladder mucosal lesions, stones, debris or anatomic variants noted.   [place] urine cytology collected.***  Imaging: Recent CT A/P w/ con (10/08/24) - no GU abnormalities (no delays)   Findings: ***  Assessment and Plan: ***  Penne Skye, MD 11/22/2024

## 2024-11-24 ENCOUNTER — Ambulatory Visit: Admitting: Urology

## 2024-11-24 VITALS — BP 99/64 | HR 69

## 2024-11-24 DIAGNOSIS — R310 Gross hematuria: Secondary | ICD-10-CM | POA: Diagnosis not present

## 2024-11-25 LAB — URINALYSIS, COMPLETE
Bilirubin, UA: NEGATIVE
Glucose, UA: NEGATIVE
Ketones, UA: NEGATIVE
Leukocytes,UA: NEGATIVE
Nitrite, UA: NEGATIVE
Protein,UA: NEGATIVE
RBC, UA: NEGATIVE
Specific Gravity, UA: 1.03 (ref 1.005–1.030)
Urobilinogen, Ur: 0.2 mg/dL (ref 0.2–1.0)
pH, UA: 6 (ref 5.0–7.5)

## 2024-11-25 LAB — MICROSCOPIC EXAMINATION: Bacteria, UA: NONE SEEN

## 2025-05-19 ENCOUNTER — Ambulatory Visit: Admitting: Urology
